# Patient Record
Sex: Male | Born: 1985 | Race: White | Hispanic: No | Smoking: Current every day smoker
Health system: Southern US, Community
[De-identification: ages and names within clinical notes are randomized; demographics above are authoritative.]

## PROBLEM LIST (undated history)

## (undated) DIAGNOSIS — F1990 Other psychoactive substance use, unspecified, uncomplicated: Secondary | ICD-10-CM

## (undated) DIAGNOSIS — F1920 Other psychoactive substance dependence, uncomplicated: Secondary | ICD-10-CM

## (undated) DIAGNOSIS — B1920 Unspecified viral hepatitis C without hepatic coma: Secondary | ICD-10-CM

## (undated) DIAGNOSIS — R45851 Suicidal ideations: Secondary | ICD-10-CM

---

## 2016-10-17 ENCOUNTER — Emergency Department (HOSPITAL_COMMUNITY): Payer: Self-pay

## 2016-10-17 ENCOUNTER — Other Ambulatory Visit (HOSPITAL_COMMUNITY): Payer: Self-pay

## 2016-10-17 ENCOUNTER — Inpatient Hospital Stay (HOSPITAL_COMMUNITY)
Admission: EM | Admit: 2016-10-17 | Discharge: 2016-10-18 | DRG: 917 | Disposition: A | Discharge status: Home / Self Care | Payer: Self-pay | Attending: Internal Medicine | Admitting: Internal Medicine

## 2016-10-17 ENCOUNTER — Encounter (HOSPITAL_COMMUNITY): Payer: Self-pay

## 2016-10-17 ENCOUNTER — Inpatient Hospital Stay (HOSPITAL_COMMUNITY): Payer: Self-pay

## 2016-10-17 DIAGNOSIS — I248 Other forms of acute ischemic heart disease: Secondary | ICD-10-CM | POA: Diagnosis present

## 2016-10-17 DIAGNOSIS — T50901A Poisoning by unspecified drugs, medicaments and biological substances, accidental (unintentional), initial encounter: Secondary | ICD-10-CM | POA: Insufficient documentation

## 2016-10-17 DIAGNOSIS — K029 Dental caries, unspecified: Secondary | ICD-10-CM | POA: Diagnosis present

## 2016-10-17 DIAGNOSIS — F17210 Nicotine dependence, cigarettes, uncomplicated: Secondary | ICD-10-CM | POA: Diagnosis present

## 2016-10-17 DIAGNOSIS — J9601 Acute respiratory failure with hypoxia: Secondary | ICD-10-CM

## 2016-10-17 DIAGNOSIS — J810 Acute pulmonary edema: Secondary | ICD-10-CM | POA: Diagnosis present

## 2016-10-17 DIAGNOSIS — R778 Other specified abnormalities of plasma proteins: Secondary | ICD-10-CM

## 2016-10-17 DIAGNOSIS — J9621 Acute and chronic respiratory failure with hypoxia: Secondary | ICD-10-CM | POA: Diagnosis present

## 2016-10-17 DIAGNOSIS — Z79899 Other long term (current) drug therapy: Secondary | ICD-10-CM

## 2016-10-17 DIAGNOSIS — R0902 Hypoxemia: Secondary | ICD-10-CM

## 2016-10-17 DIAGNOSIS — R7401 Elevation of levels of liver transaminase levels: Secondary | ICD-10-CM

## 2016-10-17 DIAGNOSIS — K047 Periapical abscess without sinus: Secondary | ICD-10-CM | POA: Diagnosis present

## 2016-10-17 DIAGNOSIS — R740 Nonspecific elevation of levels of transaminase and lactic acid dehydrogenase [LDH]: Secondary | ICD-10-CM | POA: Diagnosis present

## 2016-10-17 DIAGNOSIS — R7989 Other specified abnormal findings of blood chemistry: Secondary | ICD-10-CM

## 2016-10-17 DIAGNOSIS — Z882 Allergy status to sulfonamides status: Secondary | ICD-10-CM

## 2016-10-17 DIAGNOSIS — T401X1A Poisoning by heroin, accidental (unintentional), initial encounter: Principal | ICD-10-CM | POA: Diagnosis present

## 2016-10-17 DIAGNOSIS — J811 Chronic pulmonary edema: Secondary | ICD-10-CM | POA: Insufficient documentation

## 2016-10-17 LAB — I-STAT TROPONIN, ED: Troponin i, poc: 0.12 ng/mL (ref 0.00–0.08)

## 2016-10-17 LAB — BLOOD GAS, VENOUS
ACID-BASE EXCESS: 0.9 mmol/L (ref 0.0–2.0)
Bicarbonate: 29.2 mmol/L — ABNORMAL HIGH (ref 20.0–28.0)
FIO2: 1
O2 Saturation: 29.4 %
PATIENT TEMPERATURE: 98.6
PH VEN: 7.281 (ref 7.250–7.430)
pCO2, Ven: 64.1 mmHg — ABNORMAL HIGH (ref 44.0–60.0)

## 2016-10-17 LAB — MRSA PCR SCREENING: MRSA BY PCR: NEGATIVE

## 2016-10-17 LAB — HEPATIC FUNCTION PANEL
ALK PHOS: 50 U/L (ref 38–126)
ALT: 94 U/L — AB (ref 17–63)
AST: 57 U/L — ABNORMAL HIGH (ref 15–41)
Albumin: 3.8 g/dL (ref 3.5–5.0)
BILIRUBIN DIRECT: 0.1 mg/dL (ref 0.1–0.5)
Indirect Bilirubin: 0.5 mg/dL (ref 0.3–0.9)
TOTAL PROTEIN: 6.9 g/dL (ref 6.5–8.1)
Total Bilirubin: 0.6 mg/dL (ref 0.3–1.2)

## 2016-10-17 LAB — CBC
HEMATOCRIT: 42.6 % (ref 39.0–52.0)
Hemoglobin: 15.3 g/dL (ref 13.0–17.0)
MCH: 33.6 pg (ref 26.0–34.0)
MCHC: 35.9 g/dL (ref 30.0–36.0)
MCV: 93.4 fL (ref 78.0–100.0)
Platelets: 199 10*3/uL (ref 150–400)
RBC: 4.56 MIL/uL (ref 4.22–5.81)
RDW: 13.2 % (ref 11.5–15.5)
WBC: 13.7 10*3/uL — ABNORMAL HIGH (ref 4.0–10.5)

## 2016-10-17 LAB — BASIC METABOLIC PANEL
ANION GAP: 11 (ref 5–15)
BUN: 17 mg/dL (ref 6–20)
CALCIUM: 8.5 mg/dL — AB (ref 8.9–10.3)
CO2: 21 mmol/L — AB (ref 22–32)
CREATININE: 0.99 mg/dL (ref 0.61–1.24)
Chloride: 106 mmol/L (ref 101–111)
GFR calc Af Amer: >60 mL/min (ref >60)
GFR calc non Af Amer: >60 mL/min (ref >60)
GLUCOSE: 149 mg/dL — AB (ref 65–99)
Potassium: 3.7 mmol/L (ref 3.5–5.1)
Sodium: 138 mmol/L (ref 135–145)

## 2016-10-17 LAB — BRAIN NATRIURETIC PEPTIDE: B Natriuretic Peptide: 28.1 pg/mL (ref 0.0–100.0)

## 2016-10-17 LAB — TROPONIN I
TROPONIN I: 0.05 ng/mL — AB (ref <0.03)
Troponin I: 0.13 ng/mL (ref <0.03)
Troponin I: 0.09 ng/mL (ref <0.03)

## 2016-10-17 LAB — D-DIMER, QUANTITATIVE: D-Dimer, Quant: 0.55 ug/mL-FEU — ABNORMAL HIGH (ref 0.00–0.50)

## 2016-10-17 LAB — MAGNESIUM: MAGNESIUM: 1.8 mg/dL (ref 1.7–2.4)

## 2016-10-17 MED ORDER — ENOXAPARIN SODIUM 40 MG/0.4ML ~~LOC~~ SOLN: 40.0000 mg | Freq: Every day | SUBCUTANEOUS | Status: DC

## 2016-10-17 MED ORDER — DEXAMETHASONE SODIUM PHOSPHATE 10 MG/ML IJ SOLN
10.0000 mg | Freq: Once | INTRAMUSCULAR | Status: AC
  Administered 2016-10-17: 10 mg via INTRAVENOUS
  Filled 2016-10-17: qty 1

## 2016-10-17 MED ORDER — KETOROLAC TROMETHAMINE 15 MG/ML IJ SOLN
15.0000 mg | Freq: Once | INTRAMUSCULAR | Status: AC
  Administered 2016-10-17: 15 mg via INTRAVENOUS
  Filled 2016-10-17: qty 1

## 2016-10-17 MED ORDER — ONDANSETRON HCL 4 MG/2ML IJ SOLN: 4.0000 mg | Freq: Four times a day (QID) | INTRAMUSCULAR | Status: DC | PRN

## 2016-10-17 MED ORDER — AMOXICILLIN 500 MG PO CAPS
500.0000 mg | ORAL_CAPSULE | Freq: Three times a day (TID) | ORAL | Status: DC
  Administered 2016-10-18: 500 mg via ORAL
  Filled 2016-10-17 (×2): qty 1

## 2016-10-17 MED ORDER — IOPAMIDOL (ISOVUE-370) INJECTION 76%: 100.0000 mL | Freq: Once | INTRAVENOUS | Status: DC | PRN

## 2016-10-17 MED ORDER — NICOTINE 21 MG/24HR TD PT24: 21.0000 mg | MEDICATED_PATCH | Freq: Every day | TRANSDERMAL | Status: DC

## 2016-10-17 MED ORDER — FUROSEMIDE 10 MG/ML IJ SOLN
20.0000 mg | Freq: Once | INTRAMUSCULAR | Status: AC
  Administered 2016-10-17: 20 mg via INTRAVENOUS
  Filled 2016-10-17: qty 4

## 2016-10-17 MED ORDER — IOPAMIDOL (ISOVUE-370) INJECTION 76%
INTRAVENOUS | Status: AC
  Filled 2016-10-17: qty 100

## 2016-10-17 MED ORDER — NICOTINE 21 MG/24HR TD PT24
21.0000 mg | MEDICATED_PATCH | Freq: Once | TRANSDERMAL | Status: AC
  Administered 2016-10-17: 21 mg via TRANSDERMAL
  Filled 2016-10-17: qty 1

## 2016-10-17 MED ORDER — ONDANSETRON HCL 4 MG PO TABS: 4.0000 mg | ORAL_TABLET | Freq: Four times a day (QID) | ORAL | Status: DC | PRN

## 2016-10-17 MED ORDER — SERTRALINE HCL 100 MG PO TABS: 200.0000 mg | ORAL_TABLET | Freq: Every day | ORAL | Status: DC

## 2016-10-17 MED ORDER — FUROSEMIDE 10 MG/ML IJ SOLN: 40.0000 mg | Freq: Once | INTRAMUSCULAR | Status: DC

## 2016-10-17 MED ORDER — SODIUM CHLORIDE 0.9 % IV BOLUS (SEPSIS): 1000.0000 mL | Freq: Once | INTRAVENOUS | Status: DC

## 2016-10-17 MED ORDER — SODIUM CHLORIDE 0.9 % IV SOLN: 3.0000 g | Freq: Once | INTRAVENOUS | Status: DC

## 2016-10-17 MED ORDER — GABAPENTIN 100 MG PO CAPS
300.0000 mg | ORAL_CAPSULE | Freq: Every day | ORAL | Status: DC
  Administered 2016-10-17: 300 mg via ORAL
  Filled 2016-10-17: qty 3

## 2016-10-17 MED ORDER — TRAMADOL HCL 50 MG PO TABS
50.0000 mg | ORAL_TABLET | Freq: Four times a day (QID) | ORAL | Status: DC | PRN
  Administered 2016-10-17: 50 mg via ORAL
  Filled 2016-10-17: qty 1

## 2016-10-17 MED ORDER — SODIUM CHLORIDE 0.9 % IV SOLN
3.0000 g | Freq: Four times a day (QID) | INTRAVENOUS | Status: DC
  Administered 2016-10-17: 3 g via INTRAVENOUS
  Filled 2016-10-17 (×3): qty 3

## 2016-10-17 MED ORDER — ACETAMINOPHEN 325 MG PO TABS: 650.0000 mg | ORAL_TABLET | Freq: Four times a day (QID) | ORAL | Status: DC | PRN

## 2016-10-17 MED ORDER — SODIUM CHLORIDE 0.9 % IV SOLN
3.0000 g | Freq: Once | INTRAVENOUS | Status: AC
  Administered 2016-10-17: 3 g via INTRAVENOUS
  Filled 2016-10-17: qty 3

## 2016-10-17 MED ORDER — ASPIRIN 81 MG PO CHEW
324.0000 mg | CHEWABLE_TABLET | Freq: Once | ORAL | Status: AC
  Administered 2016-10-17: 324 mg via ORAL
  Filled 2016-10-17: qty 4

## 2016-10-17 MED ORDER — LEVALBUTEROL HCL 0.63 MG/3ML IN NEBU: 0.6300 mg | INHALATION_SOLUTION | Freq: Four times a day (QID) | RESPIRATORY_TRACT | Status: DC | PRN

## 2016-10-17 MED ORDER — ACETAMINOPHEN 650 MG RE SUPP: 650.0000 mg | Freq: Four times a day (QID) | RECTAL | Status: DC | PRN

## 2016-10-17 MED ORDER — IPRATROPIUM-ALBUTEROL 0.5-2.5 (3) MG/3ML IN SOLN
3.0000 mL | Freq: Once | RESPIRATORY_TRACT | Status: AC
  Administered 2016-10-17: 3 mL via RESPIRATORY_TRACT
  Filled 2016-10-17: qty 3

## 2016-10-17 MED ORDER — GABAPENTIN 100 MG PO CAPS: 100.0000 mg | ORAL_CAPSULE | Freq: Every day | ORAL | Status: DC

## 2016-10-17 NOTE — ED Notes (Signed)
Bed: RESB Expected date:  Expected time:  Means of arrival:  Comments: EMS overdose 

## 2016-10-17 NOTE — Progress Notes (Signed)
Patient difficult IV stick/start. Multiple attempts made by RNs, IV team and Radiology tech without any success. Patient states he is always a difficult stick for IVs and doesn't want any more attempts made because he is going home in morning. [facility]level on call paged and made aware. Unable to complete CT angio and give ordered IV antibiotics. Patient requested that PIV located in right upper arm be removed because it has become painful. Will continue to monitor.

## 2016-10-17 NOTE — Progress Notes (Signed)
Pharmacy Antibiotic Note  George Walls is a 31 y.o. male admitted on 10/17/2016 with dental infection.  Pharmacy has been consulted for Unasyn dosing.  Plan: Unasyn 3 gr IV q6h  Monitor clinical course, renal function, cultures as available  Dosage will likely  remain stable at above dosage  and need for further dosage adjustment appears unlikely at present.    Will sign off at this time.  Please reconsult if a change in clinical status warrants re-evaluation of dosage.     Height: 5\' 11"  (180.3 cm) Weight: 185 lb (83.9 kg) IBW/kg (Calculated) : 75.3  Temp (24hrs), Avg:97.6 F (36.4 C), Min:97.6 F (36.4 C), Max:97.6 F (36.4 C)   Recent Labs Lab 10/17/16 0810  WBC 13.7*  CREATININE 0.99    Estimated Creatinine Clearance: 115.1 mL/min (by C-G formula based on SCr of 0.99 mg/dL).    Allergies  Allergen Reactions  . Sulfa Antibiotics Other (See Comments)    Reaction:  Unknown     Antimicrobials this admission:  7/20 unasyn >>  Dose adjustments this admission:   Microbiology results: 7/20 HIV antibody  >>  Thank you for allowing pharmacy to be a part of this patient's care.  George Walls, PharmD, BCPS Pager [phone] 10/17/2016 12:55 PM

## 2016-10-17 NOTE — H&P (Addendum)
[facility] History and Physical  Drinda ButtsChristopher Egnor NWG:[id number] DOB: [birth year] DOA: 10/17/2016  Referring physician: ED  PCP: Patient, No Pcp Per   Chief Complaint: Altered mental status  HPI:  31 year old male with a history of substance abuse in the past, who presents to the ER after IV drug use. Patient used IV heroin last night, due to multiple reasons. Patient has had left to ache for several weeks and feels that that is a dental abscess, and is in a lot of pain. He also stated that his ex-wife just had a baby with another man, and this has stressed him out. Patient was found obtunded by his landlord who gave him Narcan, which arose him, after that he went to sleep. This morning patient was again found to be minimally responsive, landlord called EMS and fire department. He received 3 doses of Narcan was brought to the ER for further evaluation ED course BP (!) 152/74   Pulse (!) 101   Temp 97.6 F (36.4 C) (Axillary)   Resp (!) 29   Patient was found to be unresponsive initially, after receiving Narcan he became somewhat more awake. Chest x-ray showed pulmonary edema. Patient received Lasix 20 mg IV 1. As well as nebulizer treatments Patient was given IV Toradol and Decadron for pain, Currently patient is awake, denies any homicidal or suicidal ideation. He is being admitted to step down for acute hypoxic respiratory failure, as he is currently needing 10 L of oxygen    Review of Systems: negative for the following  Constitutional: Positive for decreased responsiveness  HEENT: Denies photophobia, eye pain, redness, hearing loss, ear pain, congestion, sore throat, rhinorrhea, sneezing, mouth sores, trouble swallowing, neck pain, neck stiffness and tinnitus.  Respiratory: Denies SOB, DOE, cough, chest tightness, and wheezing.  Cardiovascular: Denies chest pain, palpitations and leg swelling.  Gastrointestinal: Denies nausea, vomiting, abdominal pain, diarrhea,  constipation, blood in stool and abdominal distention.  Genitourinary: Denies dysuria, urgency, frequency, hematuria, flank pain and difficulty urinating.  Musculoskeletal: Denies myalgias, back pain, joint swelling, arthralgias and gait problem.  Skin: Denies pallor, rash and wound.  Neurological: Denies dizziness, seizures, syncope, weakness, light-headedness, numbness and headaches.  Hematological: Denies adenopathy. Easy bruising, personal or family bleeding history  Psychiatric/Behavioral: Denies suicidal ideation, mood changes, confusion, nervousness, sleep disturbance and agitation       History reviewed. No pertinent past medical history.   History reviewed. No pertinent surgical history.    Social History:  reports that he has been smoking Cigarettes.  He has been smoking about 2.00 packs per day. He has never used smokeless tobacco. He reports that he uses drugs, including Cocaine and Marijuana. He reports that he does not drink alcohol.    Allergies  Allergen Reactions  . Sulfa Antibiotics Other (See Comments)    Reaction:  Unknown         FAMILY HISTORY  When questioned  Directly-patient reports  No family history of HTN, CVA ,DIABETES, TB, Cancer CAD, Bleeding Disorders, Sickle Cell, diabetes, anemia, asthma,   Prior to Admission medications   Medication Sig Start Date End Date Taking? Authorizing Provider  gabapentin (NEURONTIN) 100 MG capsule Take 100-300 mg by mouth 2 (two) times daily. Pt takes one in the morning and three at bedtime.   Yes [provider]  sertraline (ZOLOFT) 100 MG tablet Take 200 mg by mouth daily.   Yes [provider]     Physical Exam: Vitals:   10/17/16 [id number] 10/17/16 [id number] 10/17/16 0950 10/17/16  1028  BP: 126/84  124/85 (!) 152/74  Pulse: 100  (!) 107 (!) 101  Resp: 15  20 (!) 29  Temp:      TempSrc:      SpO2: 95% 96% 94% 96%        Vitals:   10/17/16 0815 10/17/16 0844 10/17/16 0950 10/17/16 1028  BP:  126/84  124/85 (!) 152/74  Pulse: 100  (!) 107 (!) 101  Resp: 15  20 (!) 29  Temp:      TempSrc:      SpO2: 95% 96% 94% 96%   Constitutional: NAD, Anxious,, On 10 L of oxygen Eyes: PERRL, lids and conjunctivae normal ENMT: Mucous membranes are moist. Posterior pharynx clear of any exudate or lesions.Normal dentition.  Neck: normal, supple, no masses, no thyromegaly Respiratory: Normal respiratory effort    No respiratory distress. He has no wheezes. He has rales in the right upper field, the right middle field, the right lower field, the left upper field, the left middle field and the left lower field Cardiovascular: Regular rate and rhythm, no murmurs / rubs / gallops. No extremity edema. 2+ pedal pulses. No carotid bruits.  Abdomen: no tenderness, no masses palpated. No hepatosplenomegaly. Bowel sounds positive.  Musculoskeletal: no clubbing / cyanosis. No joint deformity upper and lower extremities. Good ROM, no contractures. Normal muscle tone.  Skin: no rashes, lesions, ulcers. No induration Neurologic: CN 2-12 grossly intact. Sensation intact, DTR normal. Strength 5/5 in all 4.  Psychiatric: Normal judgment and insight. Alert and oriented x 3. Normal mood.     Labs on Admission: I have personally reviewed following labs and imaging studies  CBC:  Recent Labs Lab 10/17/16 0810  WBC 13.7*  HGB 15.3  HCT 42.6  MCV 93.4  PLT 199    Basic Metabolic Panel:  Recent Labs Lab 10/17/16 0810  NA 138  K 3.7  CL 106  CO2 21*  GLUCOSE 149*  BUN 17  CREATININE 0.99  CALCIUM 8.5*    GFR: CrCl cannot be calculated (Unknown ideal weight.).  Liver Function Tests: No results for input(s): AST, ALT, ALKPHOS, BILITOT, PROT, ALBUMIN in the last 168 hours. No results for input(s): LIPASE, AMYLASE in the last 168 hours. No results for input(s): AMMONIA in the last 168 hours.  Coagulation Profile: No results for input(s): INR, PROTIME in the last 168 hours. No results for  input(s): DDIMER in the last 72 hours.  Cardiac Enzymes: No results for input(s): CKTOTAL, CKMB, CKMBINDEX, TROPONINI in the last 168 hours.  BNP (last 3 results) No results for input(s): PROBNP in the last 8760 hours.  HbA1C: No results for input(s): HGBA1C in the last 72 hours. No results found for: HGBA1C   CBG: No results for input(s): GLUCAP in the last 168 hours.  Lipid Profile: No results for input(s): CHOL, HDL, LDLCALC, TRIG, CHOLHDL, LDLDIRECT in the last 72 hours.  Thyroid Function Tests: No results for input(s): TSH, T4TOTAL, FREET4, T3FREE, THYROIDAB in the last 72 hours.  Anemia Panel: No results for input(s): VITAMINB12, FOLATE, FERRITIN, TIBC, IRON, RETICCTPCT in the last 72 hours.  Urine analysis: No results found for: COLORURINE, APPEARANCEUR, LABSPEC, PHURINE, GLUCOSEU, HGBUR, BILIRUBINUR, KETONESUR, PROTEINUR, UROBILINOGEN, NITRITE, LEUKOCYTESUR  Sepsis Labs: @LABRCNTIP (procalcitonin:4,lacticidven:4) )No results found for this or any previous visit (from the past 240 hour(s)).       Radiological Exams on Admission: Dg Chest Portable 1 View  Result Date: 10/17/2016 CLINICAL DATA:  31 year old male with cough, shortness of breath  and hypoxia status post opiate overdose. EXAM: PORTABLE CHEST 1 VIEW COMPARISON:  None. FINDINGS: Portable AP semi upright view at 0829 hours. Normal cardiac size and mediastinal contours. Visualized tracheal air column is within normal limits. No pneumothorax, pleural effusion or consolidation. However, there is patchy bilateral indistinct increased pulmonary interstitial opacity. No acute osseous abnormality identified. Paucity of bowel gas in the upper abdomen. IMPRESSION: Positive for pulmonary edema. No other acute cardiopulmonary abnormality. Electronically Signed   By: Odessa Fleming M.D.   On: 10/17/2016 08:39   Dg Chest Portable 1 View  Result Date: 10/17/2016 CLINICAL DATA:  31 year old male with cough, shortness of breath and  hypoxia status post opiate overdose. EXAM: PORTABLE CHEST 1 VIEW COMPARISON:  None. FINDINGS: Portable AP semi upright view at 0829 hours. Normal cardiac size and mediastinal contours. Visualized tracheal air column is within normal limits. No pneumothorax, pleural effusion or consolidation. However, there is patchy bilateral indistinct increased pulmonary interstitial opacity. No acute osseous abnormality identified. Paucity of bowel gas in the upper abdomen. IMPRESSION: Positive for pulmonary edema. No other acute cardiopulmonary abnormality. Electronically Signed   By: Odessa Fleming M.D.   On: 10/17/2016 08:39      EKG: Independently reviewed.  Sinus tachycardia No old tracing to compare   Assessment/Plan Principal Problem:   Noncardiogenic pulmonary edema/acute hypoxic respiratory failure  NCPE Secondary to IV drug use Currently on 10 L, patient will be admitted to step down for closer monitoring Patient did receive Lasix 20 mg IV 1, will give another dose and assess need for future doses Troponin mildly elevated, trend troponin Echo to rule out wall motion abnormalities D-dimer elevated , will check CT PE study to r/o PE      Drug overdose This seemed to be accidental and patient has not intent to harm himself He relapsed after 6 months, he regrets it Monitor for withdrawal   Dental abscess Maxillofacial CT to rule out drainable abscess Continue Unasyn  DVT prophylaxis:  Lovenox     Code Status Orders Full code           consults called:None  Family Communication: Admission, patients condition and plan of care including tests being ordered have been discussed with the patient  who indicates understanding and agree with the plan and Code Status  Admission status: inpatient    Disposition plan: Further plan will depend as patient's clinical course evolves and further radiologic and laboratory data become available. Likely home when stable   At the time of admission, it  appears that the appropriate admission status for this patient is INPATIENT .Thisis judged to be reasonable and necessary in order to provide the required intensity of service to ensure the patient's safetygiven thepresenting symptoms, physical exam findings, and initial radiographic and laboratory data in the context of their chronic comorbidities.   Richarda Overlie MD [facility] Pager [phone]  If 7PM-7AM, please contact night-coverage [email or web address] Password [facility]  10/17/2016, 10:49 AM

## 2016-10-17 NOTE — ED Notes (Signed)
Two unsuccessful attempts at peripheral iv.  

## 2016-10-17 NOTE — ED Provider Notes (Signed)
[facility]-EMERGENCY DEPT Provider Note   CSN: [id number] Arrival date & time: 10/17/16  4098     History   Chief Complaint Chief Complaint  Patient presents with  . Drug Overdose    HPI George Walls is a 31 y.o. male.  HPI   31 year old male with past medical history of substance abuse here with accidental overdose. The patient states that last night, he used heroin that he believes was placed with fentanyl. He did this because he has had increasing tooth pain from multiple dental infections. He states that after using, he does not remember much. He reportedly was given Narcan last night by his landlord then went back to sleep. He was found minimally responsive this morning and fire department was called. Patient has received a total of 3 mg of Narcan prior to arrival. He states he is more awake now and feels nauseous and fatigued after the Narcan. He endorses significant shortness of breath as well. He states he did not have shortness of breath prior to using heroin yesterday. Denies any intentional overdose. Denies any chest pain.  History reviewed. No pertinent past medical history.  There are no active problems to display for this patient.   History reviewed. No pertinent surgical history.     Home Medications    Prior to Admission medications   Medication Sig Start Date End Date Taking? Authorizing Provider  gabapentin (NEURONTIN) 100 MG capsule Take 100-300 mg by mouth 2 (two) times daily. Pt takes one in the morning and three at bedtime.   Yes [provider]  sertraline (ZOLOFT) 100 MG tablet Take 200 mg by mouth daily.   Yes [provider]    Family History History reviewed. No pertinent family history.  Social History Social History  Substance Use Topics  . Smoking status: Current Every Day Smoker    Packs/day: 2.00    Types: Cigarettes  . Smokeless tobacco: Never Used  . Alcohol use No     Allergies   Sulfa  antibiotics   Review of Systems Review of Systems  Constitutional: Positive for fatigue.  Respiratory: Positive for cough and shortness of breath.   All other systems reviewed and are negative.    Physical Exam Updated Vital Signs BP (!) 152/74   Pulse (!) 101   Temp 97.6 F (36.4 C) (Axillary)   Resp (!) 29   SpO2 96%   Physical Exam  Constitutional: He is oriented to person, place, and time. He appears well-developed and well-nourished. No distress.  HENT:  Head: Normocephalic and atraumatic.  Eyes: Conjunctivae are normal.  Neck: Neck supple.  Cardiovascular: Regular rhythm and normal heart sounds.  Tachycardia present.  Exam reveals no friction rub.   No murmur heard. Pulmonary/Chest: Effort normal. Tachypnea noted. No respiratory distress. He has no wheezes. He has rales in the right upper field, the right middle field, the right lower field, the left upper field, the left middle field and the left lower field.  Abdominal: He exhibits no distension.  Musculoskeletal: He exhibits no edema.  Neurological: He is alert and oriented to person, place, and time. He exhibits normal muscle tone.  Skin: Skin is warm. Capillary refill takes less than 2 seconds.  Psychiatric: He has a normal mood and affect.  Nursing note and vitals reviewed.    ED Treatments / Results  Labs (all labs ordered are listed, but only abnormal results are displayed) Labs Reviewed  CBC - Abnormal; Notable for the following:  Result Value   WBC 13.7 (*)    All other components within normal limits  BASIC METABOLIC PANEL - Abnormal; Notable for the following:    CO2 21 (*)    Glucose, Bld 149 (*)    Calcium 8.5 (*)    All other components within normal limits  BLOOD GAS, VENOUS - Abnormal; Notable for the following:    pCO2, Ven 64.1 (*)    Bicarbonate 29.2 (*)    All other components within normal limits  I-STAT TROPONIN, ED - Abnormal; Notable for the following:    Troponin i, poc 0.12  (*)    All other components within normal limits  BRAIN NATRIURETIC PEPTIDE    EKG  EKG Interpretation  Date/Time:  Friday October 17 2016 07:38:07 EDT Ventricular Rate:  106 PR Interval:    QRS Duration: 91 QT Interval:  341 QTC Calculation: 453 R Axis:   72 Text Interpretation:  Sinus tachycardia No old tracing to compare Confirmed by Shaune Pollack [id number]) on 10/17/2016 8:10:46 AM       Radiology Dg Chest Portable 1 View  Result Date: 10/17/2016 CLINICAL DATA:  31 year old male with cough, shortness of breath and hypoxia status post opiate overdose. EXAM: PORTABLE CHEST 1 VIEW COMPARISON:  None. FINDINGS: Portable AP semi upright view at 0829 hours. Normal cardiac size and mediastinal contours. Visualized tracheal air column is within normal limits. No pneumothorax, pleural effusion or consolidation. However, there is patchy bilateral indistinct increased pulmonary interstitial opacity. No acute osseous abnormality identified. Paucity of bowel gas in the upper abdomen. IMPRESSION: Positive for pulmonary edema. No other acute cardiopulmonary abnormality. Electronically Signed   By: Odessa Fleming M.D.   On: 10/17/2016 08:39    Procedures .Critical Care Performed by: Shaune Pollack Authorized by: Shaune Pollack     (including critical care time)  CRITICAL CARE Performed by: Dollene Cleveland   Total critical care time: 35 minutes  Critical care time was exclusive of separately billable procedures and treating other patients.  Critical care was necessary to treat or prevent imminent or life-threatening deterioration.  Critical care was time spent personally by me on the following activities: development of treatment plan with patient and/or surrogate as well as nursing, discussions with consultants, evaluation of patient's response to treatment, examination of patient, obtaining history from patient or surrogate, ordering and performing treatments and interventions, ordering and  review of laboratory studies, ordering and review of radiographic studies, pulse oximetry and re-evaluation of patient's condition.    Medications Ordered in ED Medications  nicotine (NICODERM CQ - dosed in mg/24 hours) patch 21 mg (21 mg Transdermal Patch Applied 10/17/16 0959)  aspirin chewable tablet 324 mg (not administered)  ipratropium-albuterol (DUONEB) 0.5-2.5 (3) MG/3ML nebulizer solution 3 mL (3 mLs Nebulization Given 10/17/16 0844)  furosemide (LASIX) injection 20 mg (20 mg Intravenous Given 10/17/16 0952)  Ampicillin-Sulbactam (UNASYN) 3 g in sodium chloride 0.9 % 100 mL IVPB (3 g Intravenous New Bag/Given 10/17/16 0957)  ketorolac (TORADOL) 15 MG/ML injection 15 mg (15 mg Intravenous Given 10/17/16 0952)  dexamethasone (DECADRON) injection 10 mg (10 mg Intravenous Given 10/17/16 1000)     Initial Impression / Assessment and Plan / ED Course  I have reviewed the triage vital signs and the nursing notes.  Pertinent labs & imaging results that were available during my care of the patient were reviewed by me and considered in my medical decision making (see chart for details).     31 yo M with PMHx heroin  abuse here with accidental overdose, received narcan last night and again this AM. On exam, pt is hypoxic, with increased WOB and diffuse rales. CXR shows pulmonary edema. Suspect flash pulmonary edema with subsequent hypoxic respiratory failure, likely 2/2 prolonged apnea and narcan use. Pt given low dose of lasix, will admit to step down for pulmonary monitoring. Pt awake, alert at this time - no indication for narcan gtt at this time. Pt increasingly alert, awake despite mild resp acidosis on VBG - will continue to monitor, declines BiPAP at this time and given that he is more awake, alert, feel this is reasonable.  Final Clinical Impressions(s) / ED Diagnoses   Final diagnoses:  Accidental overdose of heroin, initial encounter  Acute pulmonary edema (HCC)  Elevated troponin   Acute respiratory failure with hypoxia (HCC)      Shaune PollackIsaacs, Shanell Aden, MD 10/17/16 1040

## 2016-10-17 NOTE — ED Notes (Signed)
Pt is irritated. Continues to take his oxygen off. Pt tried to leave and continued to pull of cardiac monitoring, BP cuff, and Oxygen. Convinced pt to calm down and please stay.

## 2016-10-17 NOTE — ED Triage Notes (Signed)
Per EMS, pt is coming from home after the landlord called and stated that pt was found unresponsive after overdosing on heroin. The landlord and [employer] administered narcan this morning. The landlord also reports administering narcan at approx. 2200 last night when the pt was initially found unresponsive.

## 2016-10-18 ENCOUNTER — Inpatient Hospital Stay (HOSPITAL_COMMUNITY): Payer: Self-pay

## 2016-10-18 DIAGNOSIS — R740 Nonspecific elevation of levels of transaminase and lactic acid dehydrogenase [LDH]: Secondary | ICD-10-CM

## 2016-10-18 DIAGNOSIS — J811 Chronic pulmonary edema: Secondary | ICD-10-CM

## 2016-10-18 DIAGNOSIS — J9621 Acute and chronic respiratory failure with hypoxia: Secondary | ICD-10-CM

## 2016-10-18 DIAGNOSIS — R7401 Elevation of levels of liver transaminase levels: Secondary | ICD-10-CM

## 2016-10-18 DIAGNOSIS — K047 Periapical abscess without sinus: Secondary | ICD-10-CM

## 2016-10-18 DIAGNOSIS — R7989 Other specified abnormal findings of blood chemistry: Secondary | ICD-10-CM

## 2016-10-18 DIAGNOSIS — R778 Other specified abnormalities of plasma proteins: Secondary | ICD-10-CM

## 2016-10-18 DIAGNOSIS — T401X1A Poisoning by heroin, accidental (unintentional), initial encounter: Principal | ICD-10-CM

## 2016-10-18 LAB — COMPREHENSIVE METABOLIC PANEL
ALT: 73 U/L — AB (ref 17–63)
AST: 42 U/L — AB (ref 15–41)
Albumin: 3.3 g/dL — ABNORMAL LOW (ref 3.5–5.0)
Alkaline Phosphatase: 39 U/L (ref 38–126)
Anion gap: 8 (ref 5–15)
BILIRUBIN TOTAL: 0.5 mg/dL (ref 0.3–1.2)
BUN: 19 mg/dL (ref 6–20)
CO2: 25 mmol/L (ref 22–32)
CREATININE: 0.85 mg/dL (ref 0.61–1.24)
Calcium: 8.4 mg/dL — ABNORMAL LOW (ref 8.9–10.3)
Chloride: 105 mmol/L (ref 101–111)
Glucose, Bld: 121 mg/dL — ABNORMAL HIGH (ref 65–99)
Potassium: 3.7 mmol/L (ref 3.5–5.1)
Sodium: 138 mmol/L (ref 135–145)
TOTAL PROTEIN: 6 g/dL — AB (ref 6.5–8.1)

## 2016-10-18 LAB — CBC
HCT: 35.8 % — ABNORMAL LOW (ref 39.0–52.0)
Hemoglobin: 12.4 g/dL — ABNORMAL LOW (ref 13.0–17.0)
MCH: 33.2 pg (ref 26.0–34.0)
MCHC: 34.6 g/dL (ref 30.0–36.0)
MCV: 95.7 fL (ref 78.0–100.0)
PLATELETS: 174 10*3/uL (ref 150–400)
RBC: 3.74 MIL/uL — ABNORMAL LOW (ref 4.22–5.81)
RDW: 13.3 % (ref 11.5–15.5)
WBC: 11.2 10*3/uL — AB (ref 4.0–10.5)

## 2016-10-18 LAB — HIV ANTIBODY (ROUTINE TESTING W REFLEX): HIV Screen 4th Generation wRfx: NONREACTIVE

## 2016-10-18 MED ORDER — VITAMINS A & D EX OINT
TOPICAL_OINTMENT | CUTANEOUS | Status: DC
Start: 2016-10-18 — End: 2016-10-18
  Filled 2016-10-18: qty 5

## 2016-10-18 MED ORDER — AMOXICILLIN-POT CLAVULANATE 875-125 MG PO TABS: 1.0000 | ORAL_TABLET | Freq: Two times a day (BID) | ORAL | 0 refills | Status: DC

## 2016-10-18 MED ORDER — TRAMADOL HCL 50 MG PO TABS: 50.0000 mg | ORAL_TABLET | Freq: Four times a day (QID) | ORAL | 0 refills | Status: DC | PRN

## 2016-10-18 MED ORDER — AMOXICILLIN-POT CLAVULANATE 875-125 MG PO TABS: 1.0000 | ORAL_TABLET | Freq: Two times a day (BID) | ORAL | Status: DC

## 2016-10-18 NOTE — Discharge Summary (Addendum)
Physician Discharge Summary  Drinda ButtsChristopher Nitta NFA:[id number] DOB: September 30, 1985 DOA: 10/17/2016  PCP: Patient, No Pcp Per  Admit date: 10/17/2016 Discharge date: 10/18/2016  Admitted From: home Disposition:  Home  Recommendations for Outpatient Follow-up:  1. Follow up with Dentist  1 weeks   Home Health:No Equipment/Devices:none  Discharge Condition:stable CODE STATUS:full Diet recommendation:  Regular  Brief/Interim Summary: 31 y.o. 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 Patient was obtunded but his landlord Narcan was given to him which he arose but then went back to sleep, the landlord and called EMS he received 3 doses of Narcan and was brought to the ED  Discharge Diagnoses:  Principal Problem:   Pulmonary edema, noncardiac Active Problems:   Accidental drug overdose   Acute on chronic respiratory failure with hypoxia (HCC)   Transaminitis   Elevated troponin  Acute on chronic respiratory failure with hypoxia (HCC) due to Pulmonary edema, noncardiac/Elevated troponins Improvement with Narcan. Patient did receive Lasix in the ED, Creatinine has remained at baseline, had a mild elevation in troponins have trended down, and the setting of acute hypoxic respiratory failure likely demand ischemia. Discontinue 2-D echo has no murmurs and physical exam.I have personally reviewed the Twelve-lead EKG shows sinus tachycardia, normal axis no T wave abnormalities. D-dimer was elevated due to chest compressions and probably IV drug abuse he is not tachycardic, he has no risk factors. Will DC CT scan of the chest.  Drug overdose Seems to be accidental he has access to 6 months.  Transaminitis HIV is negative. Condition does relate he has hepatitis C. We'll confirm with hepatitis panel  Dental abscesses Seen on CT  maxillofacial, one inflamed left sinus, blood cultures not ordered on admission. Local head and order them at this point, we'll send him home on Augmentin for 7 days he has a follow-up with a dentist next week.  Discharge Instructions  Discharge Instructions    Diet - low sodium heart healthy    Complete by:  As directed    Increase activity slowly    Complete by:  As directed      Allergies as of 10/18/2016      Reactions   Sulfa Antibiotics Other (See Comments)   Reaction:  Unknown       Medication List    TAKE these medications   amoxicillin-clavulanate 875-125 MG tablet Commonly known as:  AUGMENTIN Take 1 tablet by mouth every 12 (twelve) hours.   gabapentin 100 MG capsule Commonly known as:  NEURONTIN Take 100-300 mg by mouth 2 (two) times daily. Pt takes one in the morning and three at bedtime.   sertraline 100 MG tablet Commonly known as:  ZOLOFT Take 200 mg by mouth daily.   traMADol 50 MG tablet Commonly known as:  ULTRAM Take 1 tablet (50 mg total) by mouth every 6 (six) hours as needed for moderate pain.       Allergies  Allergen Reactions  . Sulfa Antibiotics Other (See Comments)    Reaction:  Unknown     Consultations:  None   Procedures/Studies: Dg Chest Portable 1 View  Result Date: 10/17/2016 CLINICAL DATA:  31 year old male with cough, shortness of breath and hypoxia status post opiate overdose. EXAM: PORTABLE CHEST 1 VIEW COMPARISON:  None. FINDINGS: Portable AP semi upright view at 0829 hours. Normal cardiac size and mediastinal contours.  Visualized tracheal air column is within normal limits. No pneumothorax, pleural effusion or consolidation. However, there is patchy bilateral indistinct increased pulmonary interstitial opacity. No acute osseous abnormality identified. Paucity of bowel gas in the upper abdomen. IMPRESSION: Positive for pulmonary edema. No other acute cardiopulmonary abnormality. Electronically Signed   By: Odessa Fleming M.D.   On:  10/17/2016 08:39   Ct Maxillofacial Wo Contrast  Result Date: 10/17/2016 CLINICAL DATA:  30 year old male with suspected left side dental infection. Left face easier and neck pain for 2 weeks. EXAM: CT MAXILLOFACIAL WITHOUT CONTRAST TECHNIQUE: Multidetector CT imaging of the maxillofacial structures was performed. Multiplanar CT image reconstructions were also generated. COMPARISON:  None. FINDINGS: Osseous: Widespread carious and poor dentition. Right maxillary and mandible posterior molars an wisdom teeth are among the worst (series 10, image 34). Prominent dental caries of the left mandible wisdom tooth, and left maxillary molar an wisdom tooth. Periapical lucency at the left maxillary wisdom tooth, and also the left maxillary canine, the root of which is in close proximity to alveolar recess mucosal thickening of the left maxillary sinus. See series 4, image 39 and series 10, image 55. No mandible osteomyelitis. No acute facial bone fracture. Visible central skullbase and calvarium appear intact. Negative visible cervical spine. Orbits: Bilateral orbital walls are intact. Bilateral orbits soft tissues are normal. Sinuses: Bilateral tympanic cavities and mastoids are clear. The right paranasal sinuses are clear. The left paranasal sinuses are clear aside from mild left maxillary sinus mucosal thickening, most pronounced at the alveolar recess. Rightward nasal septal deviation and spurring. Symmetric but mild bilateral nasal cavity mucosal thickening. Soft tissues: Negative noncontrast appearance of the visible larynx, pharynx, parapharyngeal spaces, retropharyngeal space, sublingual space, submandibular glands, and parotid glands. No cervical lymphadenopathy. Limited intracranial: Negative visualized noncontrast brain parenchyma. Visualized scalp soft tissues are within normal limits. IMPRESSION: 1. Poor dentition. On the left side the left maxillary canine and left maxillary posterior molar and  maxillary/mandible wisdom teeth are worst affected. No periapical lucency at the canine which is in proximity to the mildly inflamed left maxillary sinus. 2. No associated soft tissue cellulitis or soft tissue abscess is evident in the absence of IV contrast. Electronically Signed   By: Odessa Fleming M.D.   On: 10/17/2016 12:01    Subjective: No new complaints, he relates his pain is controlled.  Discharge Exam: Vitals:   10/17/16 2310 10/18/16 0335  BP: 127/70 120/64  Pulse: 94 97  Resp: (!) 51 18  Temp:     Vitals:   10/17/16 2200 10/17/16 2308 10/17/16 2310 10/18/16 0335  BP:   127/70 120/64  Pulse:   94 97  Resp:   (!) 51 18  Temp:  99.1 F (37.3 C)    TempSrc:  Oral    SpO2: 97%  95% 96%  Weight:      Height:        General: See progress note.    The results of significant diagnostics from this hospitalization (including imaging, microbiology, ancillary and laboratory) are listed below for reference.     Microbiology: Recent Results (from the past 240 hour(s))  MRSA PCR Screening     Status: None   Collection Time: 10/17/16  1:30 PM  Result Value Ref Range Status   MRSA by PCR NEGATIVE NEGATIVE Final    Comment:        The GeneXpert MRSA Assay (FDA approved for NASAL specimens only), is one component of a comprehensive MRSA colonization surveillance program.  It is not intended to diagnose MRSA infection nor to guide or monitor treatment for MRSA infections.      Labs: BNP (last 3 results)  Recent Labs  10/17/16 0810  BNP 28.1   Basic Metabolic Panel:  Recent Labs Lab 10/17/16 0810 10/17/16 1356 10/18/16 0312  NA 138  --  138  K 3.7  --  3.7  CL 106  --  105  CO2 21*  --  25  GLUCOSE 149*  --  121*  BUN 17  --  19  CREATININE 0.99  --  0.85  CALCIUM 8.5*  --  8.4*  MG  --  1.8  --    Liver Function Tests:  Recent Labs Lab 10/17/16 1356 10/18/16 0312  AST 57* 42*  ALT 94* 73*  ALKPHOS 50 39  BILITOT 0.6 0.5  PROT 6.9 6.0*  ALBUMIN  3.8 3.3*   No results for input(s): LIPASE, AMYLASE in the last 168 hours. No results for input(s): AMMONIA in the last 168 hours. CBC:  Recent Labs Lab 10/17/16 0810 10/18/16 0312  WBC 13.7* 11.2*  HGB 15.3 12.4*  HCT 42.6 35.8*  MCV 93.4 95.7  PLT 199 174   Cardiac Enzymes:  Recent Labs Lab 10/17/16 1356 10/17/16 1647 10/17/16 2302  TROPONINI 0.13* 0.09* 0.05*   BNP: Invalid input(s): POCBNP CBG: No results for input(s): GLUCAP in the last 168 hours. D-Dimer  Recent Labs  10/17/16 1356  DDIMER 0.55*   Hgb A1c No results for input(s): HGBA1C in the last 72 hours. Lipid Profile No results for input(s): CHOL, HDL, LDLCALC, TRIG, CHOLHDL, LDLDIRECT in the last 72 hours. Thyroid function studies No results for input(s): TSH, T4TOTAL, T3FREE, THYROIDAB in the last 72 hours.  Invalid input(s): FREET3 Anemia work up No results for input(s): VITAMINB12, FOLATE, FERRITIN, TIBC, IRON, RETICCTPCT in the last 72 hours. Urinalysis No results found for: COLORURINE, APPEARANCEUR, LABSPEC, PHURINE, GLUCOSEU, HGBUR, BILIRUBINUR, KETONESUR, PROTEINUR, UROBILINOGEN, NITRITE, LEUKOCYTESUR Sepsis Labs Invalid input(s): PROCALCITONIN,  WBC,  LACTICIDVEN Microbiology Recent Results (from the past 240 hour(s))  MRSA PCR Screening     Status: None   Collection Time: 10/17/16  1:30 PM  Result Value Ref Range Status   MRSA by PCR NEGATIVE NEGATIVE Final    Comment:        The GeneXpert MRSA Assay (FDA approved for NASAL specimens only), is one component of a comprehensive MRSA colonization surveillance program. It is not intended to diagnose MRSA infection nor to guide or monitor treatment for MRSA infections.      Time coordinating discharge: Over 30 minutes  SIGNED:   Marinda Elk, MD  [facility] 10/18/2016, 7:40 AM Pager   If 7PM-7AM, please contact night-coverage [email or web address] Password [email or web address]

## 2016-10-18 NOTE — Progress Notes (Signed)
[facility] PROGRESS NOTE    Progress Note  George Walls  JYN:[id number] DOB: 1985-10-14 DOA: 10/17/2016 PCP: Patient, No Pcp Per     Brief Narrative:   George Walls is an 31 y.o. 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 Patient was obtunded but his landlord Narcan was given to him which he arose but then went back to sleep, the landlord and called EMS he received 3 doses of Narcan and was brought to the ED.  Assessment/Plan:   Acute on chronic respiratory failure with hypoxia (HCC) due to Pulmonary edema, noncardiac/Elevated troponins Improvement with Narcan. Patient did receive Lasix in the ED, had a mild elevation in troponins have trended down, and the setting of acute hypoxic respiratory failure likely demand ischemia. D-dimer was elevated due to chest compressions and probably IV drug abuse he is not tachycardic, he has no risk factors. Will DC CT scan of the chest.  Drug overdose Seems to be accidental he has access to 6 months.  Transaminitis HIV is negative. Condition does relate he has hepatitis C. We'll confirm with hepatitis panel  Dental abscesses Seen on CT maxillofacial, one inflamed left sinus, blood cultures not ordered on admission. Local head and order them at this point, currently on oral amoxicillin  DVT prophylaxis: lovenox Family Communication:none Disposition Plan/Barrier to D/C: home today Code Status:     Code Status Orders        Start     Ordered   10/17/16 1046  Full code  Continuous     10/17/16 1046    Code Status History    Date Active Date Inactive Code Status Order ID Comments User Context   This patient has a current code status but no historical code status.        IV Access:    Peripheral IV   Procedures and diagnostic studies:   Dg Chest  Portable 1 View  Result Date: 10/17/2016 CLINICAL DATA:  31 year old male with cough, shortness of breath and hypoxia status post opiate overdose. EXAM: PORTABLE CHEST 1 VIEW COMPARISON:  None. FINDINGS: Portable AP semi upright view at 0829 hours. Normal cardiac size and mediastinal contours. Visualized tracheal air column is within normal limits. No pneumothorax, pleural effusion or consolidation. However, there is patchy bilateral indistinct increased pulmonary interstitial opacity. No acute osseous abnormality identified. Paucity of bowel gas in the upper abdomen. IMPRESSION: Positive for pulmonary edema. No other acute cardiopulmonary abnormality. Electronically Signed   By: Odessa FlemingH  Hall M.D.   On: 10/17/2016 08:39   Ct Maxillofacial Wo Contrast  Result Date: 10/17/2016 CLINICAL DATA:  31 year old male with suspected left side dental infection. Left face easier and neck pain for 2 weeks. EXAM: CT MAXILLOFACIAL WITHOUT CONTRAST TECHNIQUE: Multidetector CT imaging of the maxillofacial structures was performed. Multiplanar CT image reconstructions were also generated. COMPARISON:  None. FINDINGS: Osseous: Widespread carious and poor dentition. Right maxillary and mandible posterior molars an wisdom teeth are among the worst (series 10, image 34). Prominent dental caries of the left mandible wisdom tooth, and left maxillary molar an wisdom tooth. Periapical lucency at the left maxillary wisdom tooth, and also the left maxillary canine, the root of which is in close proximity to alveolar recess mucosal thickening of the left maxillary sinus. See series 4, image 39 and series 10, image 55. No mandible osteomyelitis. No acute  facial bone fracture. Visible central skullbase and calvarium appear intact. Negative visible cervical spine. Orbits: Bilateral orbital walls are intact. Bilateral orbits soft tissues are normal. Sinuses: Bilateral tympanic cavities and mastoids are clear. The right paranasal sinuses are clear.  The left paranasal sinuses are clear aside from mild left maxillary sinus mucosal thickening, most pronounced at the alveolar recess. Rightward nasal septal deviation and spurring. Symmetric but mild bilateral nasal cavity mucosal thickening. Soft tissues: Negative noncontrast appearance of the visible larynx, pharynx, parapharyngeal spaces, retropharyngeal space, sublingual space, submandibular glands, and parotid glands. No cervical lymphadenopathy. Limited intracranial: Negative visualized noncontrast brain parenchyma. Visualized scalp soft tissues are within normal limits. IMPRESSION: 1. Poor dentition. On the left side the left maxillary canine and left maxillary posterior molar and maxillary/mandible wisdom teeth are worst affected. No periapical lucency at the canine which is in proximity to the mildly inflamed left maxillary sinus. 2. No associated soft tissue cellulitis or soft tissue abscess is evident in the absence of IV contrast. Electronically Signed   By: Odessa Fleming M.D.   On: 10/17/2016 12:01     Medical Consultants:    None.  Anti-Infectives:   Augmentin  Subjective:    George Butts UAC feels great no new complains. He also relates he has a dental appointment this week.  Objective:    Vitals:   10/17/16 2200 10/17/16 2308 10/17/16 2310 10/18/16 0335  BP:   127/70 120/64  Pulse:   94 97  Resp:   (!) 51 18  Temp:  99.1 F (37.3 C)    TempSrc:  Oral    SpO2: 97%  95% 96%  Weight:      Height:        Intake/Output Summary (Last 24 hours) at 10/18/16 0716 Last data filed at 10/17/16 1647  Gross per 24 hour  Intake              540 ml  Output                0 ml  Net              540 ml   Filed Weights   10/17/16 1234  Weight: 83.9 kg (185 lb)    Exam: General exam: In no acute distress Respiratory system: Good air movement clear to auscultation. Cardiovascular system: Regular rate and rhythm positive S1-S2 no murmurs. Gastrointestinal system:  Abdomen soft nontender nondistended. Central nervous system: Alert and oriented 3. Extremities: No lower extremity edema. Skin: No rashes or ulcerations.  Psychiatry: Judgment and insight appeared normal, mood and affect are appropriate.   Data Reviewed:    Labs: Basic Metabolic Panel:  Recent Labs Lab 10/17/16 0810 10/17/16 1356 10/18/16 0312  NA 138  --  138  K 3.7  --  3.7  CL 106  --  105  CO2 21*  --  25  GLUCOSE 149*  --  121*  BUN 17  --  19  CREATININE 0.99  --  0.85  CALCIUM 8.5*  --  8.4*  MG  --  1.8  --    GFR Estimated Creatinine Clearance: 134.1 mL/min (by C-G formula based on SCr of 0.85 mg/dL). Liver Function Tests:  Recent Labs Lab 10/17/16 1356 10/18/16 0312  AST 57* 42*  ALT 94* 73*  ALKPHOS 50 39  BILITOT 0.6 0.5  PROT 6.9 6.0*  ALBUMIN 3.8 3.3*   No results for input(s): LIPASE, AMYLASE in the last 168 hours. No results for  input(s): AMMONIA in the last 168 hours. Coagulation profile No results for input(s): INR, PROTIME in the last 168 hours.  CBC:  Recent Labs Lab 10/17/16 0810 10/18/16 0312  WBC 13.7* 11.2*  HGB 15.3 12.4*  HCT 42.6 35.8*  MCV 93.4 95.7  PLT 199 174   Cardiac Enzymes:  Recent Labs Lab 10/17/16 1356 10/17/16 1647 10/17/16 2302  TROPONINI 0.13* 0.09* 0.05*   BNP (last 3 results) No results for input(s): PROBNP in the last 8760 hours. CBG: No results for input(s): GLUCAP in the last 168 hours. D-Dimer:  Recent Labs  10/17/16 1356  DDIMER 0.55*   Hgb A1c: No results for input(s): HGBA1C in the last 72 hours. Lipid Profile: No results for input(s): CHOL, HDL, LDLCALC, TRIG, CHOLHDL, LDLDIRECT in the last 72 hours. Thyroid function studies: No results for input(s): TSH, T4TOTAL, T3FREE, THYROIDAB in the last 72 hours.  Invalid input(s): FREET3 Anemia work up: No results for input(s): VITAMINB12, FOLATE, FERRITIN, TIBC, IRON, RETICCTPCT in the last 72 hours. Sepsis Labs:  Recent Labs Lab  10/17/16 0810 10/18/16 0312  WBC 13.7* 11.2*   Microbiology Recent Results (from the past 240 hour(s))  MRSA PCR Screening     Status: None   Collection Time: 10/17/16  1:30 PM  Result Value Ref Range Status   MRSA by PCR NEGATIVE NEGATIVE Final    Comment:        The GeneXpert MRSA Assay (FDA approved for NASAL specimens only), is one component of a comprehensive MRSA colonization surveillance program. It is not intended to diagnose MRSA infection nor to guide or monitor treatment for MRSA infections.      Medications:   . amoxicillin  500 mg Oral Q8H  . enoxaparin (LOVENOX) injection  40 mg Subcutaneous Daily  . furosemide  40 mg Intravenous Once  . gabapentin  100 mg Oral Daily  . gabapentin  300 mg Oral QHS  . iopamidol      . nicotine  21 mg Transdermal Once  . nicotine  21 mg Transdermal Daily  . sertraline  200 mg Oral Daily  . vitamin A & D       Continuous Infusions:     LOS: 1 day   Marinda Elk  [facility] Pager [phone]  *Please refer to [email or web address], password [email or web address] to get updated schedule on who will round on this patient, as hospitalists switch teams weekly. If 7PM-7AM, please contact night-coverage at [email or web address], password [email or web address] for any overnight needs.  10/18/2016, 7:16 AM

## 2016-10-20 ENCOUNTER — Inpatient Hospital Stay (HOSPITAL_COMMUNITY)
Admission: EM | Admit: 2016-10-20 | Discharge: 2016-10-23 | DRG: 202 | Disposition: A | Discharge status: Home / Self Care | Payer: Self-pay | Attending: Internal Medicine | Admitting: Internal Medicine

## 2016-10-20 ENCOUNTER — Encounter (HOSPITAL_COMMUNITY): Payer: Self-pay

## 2016-10-20 ENCOUNTER — Emergency Department (HOSPITAL_COMMUNITY): Payer: Self-pay

## 2016-10-20 DIAGNOSIS — F1920 Other psychoactive substance dependence, uncomplicated: Secondary | ICD-10-CM | POA: Diagnosis present

## 2016-10-20 DIAGNOSIS — R000 Tachycardia, unspecified: Secondary | ICD-10-CM | POA: Diagnosis present

## 2016-10-20 DIAGNOSIS — B1920 Unspecified viral hepatitis C without hepatic coma: Secondary | ICD-10-CM | POA: Diagnosis present

## 2016-10-20 DIAGNOSIS — J209 Acute bronchitis, unspecified: Principal | ICD-10-CM | POA: Diagnosis present

## 2016-10-20 DIAGNOSIS — F1120 Opioid dependence, uncomplicated: Secondary | ICD-10-CM | POA: Diagnosis present

## 2016-10-20 DIAGNOSIS — R0602 Shortness of breath: Secondary | ICD-10-CM

## 2016-10-20 DIAGNOSIS — R5081 Fever presenting with conditions classified elsewhere: Secondary | ICD-10-CM

## 2016-10-20 DIAGNOSIS — F1220 Cannabis dependence, uncomplicated: Secondary | ICD-10-CM | POA: Diagnosis present

## 2016-10-20 DIAGNOSIS — Z882 Allergy status to sulfonamides status: Secondary | ICD-10-CM

## 2016-10-20 DIAGNOSIS — F1420 Cocaine dependence, uncomplicated: Secondary | ICD-10-CM | POA: Diagnosis present

## 2016-10-20 DIAGNOSIS — Z87898 Personal history of other specified conditions: Secondary | ICD-10-CM

## 2016-10-20 DIAGNOSIS — F419 Anxiety disorder, unspecified: Secondary | ICD-10-CM | POA: Diagnosis present

## 2016-10-20 DIAGNOSIS — A419 Sepsis, unspecified organism: Secondary | ICD-10-CM | POA: Diagnosis present

## 2016-10-20 DIAGNOSIS — F17210 Nicotine dependence, cigarettes, uncomplicated: Secondary | ICD-10-CM | POA: Diagnosis present

## 2016-10-20 DIAGNOSIS — F1320 Sedative, hypnotic or anxiolytic dependence, uncomplicated: Secondary | ICD-10-CM | POA: Diagnosis present

## 2016-10-20 DIAGNOSIS — Z7289 Other problems related to lifestyle: Secondary | ICD-10-CM

## 2016-10-20 DIAGNOSIS — K029 Dental caries, unspecified: Secondary | ICD-10-CM | POA: Diagnosis present

## 2016-10-20 DIAGNOSIS — R0902 Hypoxemia: Secondary | ICD-10-CM

## 2016-10-20 HISTORY — DX: Other psychoactive substance use, unspecified, uncomplicated: F19.90

## 2016-10-20 HISTORY — DX: Other psychoactive substance dependence, uncomplicated: F19.20

## 2016-10-20 MED ORDER — ALBUTEROL SULFATE (2.5 MG/3ML) 0.083% IN NEBU
5.0000 mg | INHALATION_SOLUTION | Freq: Once | RESPIRATORY_TRACT | Status: AC
  Administered 2016-10-20: 5 mg via RESPIRATORY_TRACT
  Filled 2016-10-20: qty 6

## 2016-10-20 MED ORDER — ACETAMINOPHEN 325 MG PO TABS
650.0000 mg | ORAL_TABLET | Freq: Once | ORAL | Status: AC | PRN
  Administered 2016-10-20: 650 mg via ORAL
  Filled 2016-10-20: qty 2

## 2016-10-20 MED ORDER — DEXTROSE 5 % IV SOLN
2.0000 g | Freq: Once | INTRAVENOUS | Status: AC
  Administered 2016-10-21: 2 g via INTRAVENOUS
  Filled 2016-10-20: qty 2

## 2016-10-20 MED ORDER — VANCOMYCIN HCL IN DEXTROSE 1-5 GM/200ML-% IV SOLN
1000.0000 mg | Freq: Once | INTRAVENOUS | Status: DC
  Administered 2016-10-21: 1000 mg via INTRAVENOUS
  Filled 2016-10-20: qty 200

## 2016-10-20 NOTE — ED Triage Notes (Addendum)
[facility] with complaints of SOB. Patient has history of heroine abuse- last overdose was 10/17/16. Patient denies CP. Patient denies heroine use since his last overdose. Patient diaphoretic and warm to the touch in triage. Patient reports "losing" his Augmentin prescription after leaving the hospital on 7/21. Patient reports productive cough with yellow sputum. Patient is febrile in triage.

## 2016-10-20 NOTE — Progress Notes (Signed)
Pharmacy Antibiotic Note  George ButtsChristopher Hinchey is a 31 y.o. male admitted on 10/20/2016 with HCAP.  Pharmacy has been consulted for vancomycin and cefepime dosing. Wt 83.9 kg, Temp 99.  7/23 CXR: clear.  Pt recently in hospital - admit 10/17/16 with dental infection treated with Unasyn.  Discharged on Augmentin 7/21.   Plan: Vancomycin 1000 mg IV every 8 hours.  Goal trough 15-20 mcg/mL.  Cefepime 2 gm q12h F/u renal fxn, wbc, temp, culture data vanc levels as needed  Weight: 184 lb 15.5 oz (83.9 kg)  Temp (24hrs), Avg:100.9 F (38.3 C), Min:99 F (37.2 C), Max:102.8 F (39.3 C)   Recent Labs Lab 10/17/16 0810 10/18/16 0312  WBC 13.7* 11.2*  CREATININE 0.99 0.85    Estimated Creatinine Clearance: 134.1 mL/min (by C-G formula based on SCr of 0.85 mg/dL).    Allergies  Allergen Reactions  . Sulfa Antibiotics Other (See Comments)    Reaction:  Unknown    Thank you for allowing pharmacy to be a part of this patient's care. Herby AbrahamMichelle T. Pier Bosher, Pharm.D. [phone] 10/21/2016 12:01 AM

## 2016-10-20 NOTE — ED Notes (Signed)
ED Provider at bedside. 

## 2016-10-20 NOTE — ED Provider Notes (Signed)
[facility]-EMERGENCY DEPT Provider Note   CSN: [id number] Arrival date & time: 10/20/16  2237  By signing my name below, I, Thelma Barge, attest that this documentation has been prepared under the direction and in the presence of Zykiria Bruening, MD  Electronically Signed: Thelma Barge, Scribe. 10/20/16. 11:57 PM. History   Chief Complaint Chief Complaint  Patient presents with  . Shortness of Breath  . Anxiety   The history is provided by the patient. No language interpreter was used.  Shortness of Breath  This is a new problem. The problem occurs continuously.The current episode started 2 days ago. The problem has not changed since onset.Associated symptoms include a fever (subjective), cough and sputum production. Pertinent negatives include no wheezing, no chest pain and no leg swelling. It is unknown what precipitated the problem. Risk factors include smoking. He has tried nothing for the symptoms. He has had prior hospitalizations. He has had prior ED visits. Associated medical issues include DVT.    HPI Comments: George Walls is a 31 y.o. male who presents to the Emergency Department complaining of constant, rapid-onset SOB that occurred prior to arrival. He has associated subjective fever and productive (blood and green phlegm) cough. He took tylenol PTA for his fever. Pt was seen in the ED for a heroin overdose and admitted on 10-17-16, where he was prescribed augmentin (for a dental abscess) and tramadol, but he reports losing his prescriptions. Pt's last reported narcotics use was 10-17-16. Pt is a smoker.  History reviewed. No pertinent past medical history.  Patient Active Problem List   Diagnosis Date Noted  . Transaminitis 10/18/2016  . Elevated troponin 10/18/2016  . Accidental drug overdose 10/17/2016  . Pulmonary edema, noncardiac 10/17/2016  . Acute on chronic respiratory failure with hypoxia (HCC) 10/17/2016    History reviewed. No pertinent surgical  history.     Home Medications    Prior to Admission medications   Medication Sig Start Date End Date Taking? Authorizing Provider  amoxicillin-clavulanate (AUGMENTIN) 875-125 MG tablet Take 1 tablet by mouth every 12 (twelve) hours. 10/18/16   Marinda Elk, MD  gabapentin (NEURONTIN) 100 MG capsule Take 100-300 mg by mouth 2 (two) times daily. Pt takes one in the morning and three at bedtime.    [provider]  sertraline (ZOLOFT) 100 MG tablet Take 200 mg by mouth daily.    [provider]  traMADol (ULTRAM) 50 MG tablet Take 1 tablet (50 mg total) by mouth every 6 (six) hours as needed for moderate pain. 10/18/16   Marinda Elk, MD    Family History History reviewed. No pertinent family history.  Social History Social History  Substance Use Topics  . Smoking status: Current Every Day Smoker    Packs/day: 2.00    Types: Cigarettes  . Smokeless tobacco: Never Used  . Alcohol use No     Allergies   Sulfa antibiotics   Review of Systems Review of Systems  Constitutional: Positive for fever (subjective).  Respiratory: Positive for cough, sputum production and shortness of breath. Negative for wheezing.   Cardiovascular: Negative for chest pain, palpitations and leg swelling.  All other systems reviewed and are negative.    Physical Exam Updated Vital Signs BP (!) 114/44 (BP Location: Right Arm)   Pulse (!) 123   Temp 99 F (37.2 C) (Oral)   Resp (!) 25   Wt 184 lb 15.5 oz (83.9 kg)   SpO2 95%   BMI 25.80 kg/m   Physical Exam  Constitutional: He is oriented to person, place, and time. He appears well-developed and well-nourished.  HENT:  Head: Normocephalic and atraumatic.  Mouth/Throat: Uvula is midline and oropharynx is clear and moist. No oropharyngeal exudate.  Eyes: EOM are normal.  Neck: Normal range of motion. Neck supple.  Cardiovascular: Regular rhythm, normal heart sounds and intact distal pulses.   Tachycardic No  bruits  Pulmonary/Chest: Effort normal. No respiratory distress. He has no wheezes. He has no rales.  Decreased breath sounds No stridor Decreased breathing  Abdominal: Soft. He exhibits no distension and no mass. There is no tenderness. There is no guarding.  Musculoskeletal: Normal range of motion.  Neurological: He is alert and oriented to person, place, and time.  Skin: Skin is warm and dry. Capillary refill takes less than 2 seconds.  Psychiatric: He has a normal mood and affect. Judgment normal.  Nursing note and vitals reviewed.    ED Treatments / Results   Vitals:   10/20/16 2244 10/20/16 2245  BP: (!) 114/44 (!) 114/44  Pulse: (!) 126 (!) 123  Resp: (!) 22 (!) 25  Temp: (!) 102.8 F (39.3 C) 99 F (37.2 C)    DIAGNOSTIC STUDIES: Oxygen Saturation is 95% on RA, normal by my interpretation.    COORDINATION OF CARE: 11:53 PM Discussed treatment plan with pt at bedside and pt agreed to plan.  Labs (all labs ordered are listed, but only abnormal results are displayed)  Results for orders placed or performed during the hospital encounter of 10/20/16  CBC with Differential/Platelet  Result Value Ref Range   WBC 7.3 4.0 - 10.5 K/uL   RBC 3.77 (L) 4.22 - 5.81 MIL/uL   Hemoglobin 12.7 (L) 13.0 - 17.0 g/dL   HCT 42.5 (L) 95.6 - 38.7 %   MCV 93.9 78.0 - 100.0 fL   MCH 33.7 26.0 - 34.0 pg   MCHC 35.9 30.0 - 36.0 g/dL   RDW 56.4 33.2 - 95.1 %   Platelets 167 150 - 400 K/uL   Neutrophils Relative % 69 %   Neutro Abs 5.1 1.7 - 7.7 K/uL   Lymphocytes Relative 18 %   Lymphs Abs 1.3 0.7 - 4.0 K/uL   Monocytes Relative 8 %   Monocytes Absolute 0.6 0.1 - 1.0 K/uL   Eosinophils Relative 5 %   Eosinophils Absolute 0.3 0.0 - 0.7 K/uL   Basophils Relative 0 %   Basophils Absolute 0.0 0.0 - 0.1 K/uL  Acetaminophen level  Result Value Ref Range   Acetaminophen (Tylenol), Serum <10 (L) 10 - 30 ug/mL  Salicylate level  Result Value Ref Range   Salicylate Lvl <7.0 2.8 - 30.0  mg/dL  Ethanol  Result Value Ref Range   Alcohol, Ethyl (B) <5 <5 mg/dL  Hepatic function panel  Result Value Ref Range   Total Protein 6.6 6.5 - 8.1 g/dL   Albumin 3.4 (L) 3.5 - 5.0 g/dL   AST 884 (H) 15 - 41 U/L   ALT 294 (H) 17 - 63 U/L   Alkaline Phosphatase 55 38 - 126 U/L   Total Bilirubin 2.1 (H) 0.3 - 1.2 mg/dL   Bilirubin, Direct 0.6 (H) 0.1 - 0.5 mg/dL   Indirect Bilirubin 1.5 (H) 0.3 - 0.9 mg/dL  I-Stat Chem 8, ED  Result Value Ref Range   Sodium 136 135 - 145 mmol/L   Potassium 4.2 3.5 - 5.1 mmol/L   Chloride 102 101 - 111 mmol/L   BUN 28 (H) 6 - 20 mg/dL  Creatinine, Ser 0.90 0.61 - 1.24 mg/dL   Glucose, Bld 956118 (H) 65 - 99 mg/dL   Calcium, Ion 2.131.02 (L) 1.15 - 1.40 mmol/L   TCO2 26 0 - 100 mmol/L   Hemoglobin 11.9 (L) 13.0 - 17.0 g/dL   HCT 08.635.0 (L) 57.839.0 - 46.952.0 %  I-stat troponin, ED  Result Value Ref Range   Troponin i, poc 0.04 0.00 - 0.08 ng/mL   Comment 3          I-Stat CG4 Lactic Acid, ED  Result Value Ref Range   Lactic Acid, Venous 0.95 0.5 - 1.9 mmol/L   Dg Chest 2 View  Result Date: 10/20/2016 CLINICAL DATA:  Cough and dyspnea. EXAM: CHEST  2 VIEW COMPARISON:  10/17/2016 FINDINGS: The lungs are clear. The pulmonary vasculature is normal. Heart size is normal. Hilar and mediastinal contours are unremarkable. There is no pleural effusion. IMPRESSION: No active cardiopulmonary disease. Electronically Signed   By: Ellery Plunkaniel R Mitchell M.D.   On: 10/20/2016 23:48   Dg Chest Portable 1 View  Result Date: 10/17/2016 CLINICAL DATA:  31 year old male with cough, shortness of breath and hypoxia status post opiate overdose. EXAM: PORTABLE CHEST 1 VIEW COMPARISON:  None. FINDINGS: Portable AP semi upright view at 0829 hours. Normal cardiac size and mediastinal contours. Visualized tracheal air column is within normal limits. No pneumothorax, pleural effusion or consolidation. However, there is patchy bilateral indistinct increased pulmonary interstitial opacity. No  acute osseous abnormality identified. Paucity of bowel gas in the upper abdomen. IMPRESSION: Positive for pulmonary edema. No other acute cardiopulmonary abnormality. Electronically Signed   By: Odessa FlemingH  Hall M.D.   On: 10/17/2016 08:39   Ct Maxillofacial Wo Contrast  Result Date: 10/17/2016 CLINICAL DATA:  31 year old male with suspected left side dental infection. Left face easier and neck pain for 2 weeks. EXAM: CT MAXILLOFACIAL WITHOUT CONTRAST TECHNIQUE: Multidetector CT imaging of the maxillofacial structures was performed. Multiplanar CT image reconstructions were also generated. COMPARISON:  None. FINDINGS: Osseous: Widespread carious and poor dentition. Right maxillary and mandible posterior molars an wisdom teeth are among the worst (series 10, image 34). Prominent dental caries of the left mandible wisdom tooth, and left maxillary molar an wisdom tooth. Periapical lucency at the left maxillary wisdom tooth, and also the left maxillary canine, the root of which is in close proximity to alveolar recess mucosal thickening of the left maxillary sinus. See series 4, image 39 and series 10, image 55. No mandible osteomyelitis. No acute facial bone fracture. Visible central skullbase and calvarium appear intact. Negative visible cervical spine. Orbits: Bilateral orbital walls are intact. Bilateral orbits soft tissues are normal. Sinuses: Bilateral tympanic cavities and mastoids are clear. The right paranasal sinuses are clear. The left paranasal sinuses are clear aside from mild left maxillary sinus mucosal thickening, most pronounced at the alveolar recess. Rightward nasal septal deviation and spurring. Symmetric but mild bilateral nasal cavity mucosal thickening. Soft tissues: Negative noncontrast appearance of the visible larynx, pharynx, parapharyngeal spaces, retropharyngeal space, sublingual space, submandibular glands, and parotid glands. No cervical lymphadenopathy. Limited intracranial: Negative  visualized noncontrast brain parenchyma. Visualized scalp soft tissues are within normal limits. IMPRESSION: 1. Poor dentition. On the left side the left maxillary canine and left maxillary posterior molar and maxillary/mandible wisdom teeth are worst affected. No periapical lucency at the canine which is in proximity to the mildly inflamed left maxillary sinus. 2. No associated soft tissue cellulitis or soft tissue abscess is evident in the absence of IV contrast.  Electronically Signed   By: Odessa Fleming M.D.   On: 10/17/2016 12:01    EKG  EKG Interpretation  Date/Time:  Monday October 20 2016 23:49:56 EDT Ventricular Rate:  126 PR Interval:    QRS Duration: 84 QT Interval:  306 QTC Calculation: 443 R Axis:   61 Text Interpretation:  Sinus tachycardia Confirmed by Nicanor Alcon, Kimberly Coye ([id number]) on 10/21/2016 12:10:47 AM        Procedures Procedures (including critical care time)  Medications Ordered in ED  Medications  vancomycin (VANCOCIN) IVPB 1000 mg/200 mL premix (not administered)  ceFEPIme (MAXIPIME) 2 g in dextrose 5 % 50 mL IVPB (not administered)  vancomycin (VANCOCIN) IVPB 1000 mg/200 mL premix (not administered)  albuterol (PROVENTIL) (2.5 MG/3ML) 0.083% nebulizer solution 5 mg (5 mg Nebulization Given 10/20/16 2333)  acetaminophen (TYLENOL) tablet 650 mg (650 mg Oral Given 10/20/16 2345)  ceFEPIme (MAXIPIME) 2 g in dextrose 5 % 50 mL IVPB (0 g Intravenous Stopped 10/21/16 0205)     Final Clinical Impressions(s) / ED Diagnoses   Sirs with shortness of breath: will need inpatient IV antiobiotics, hospitalist to see  I personally performed the services described in this documentation, which was scribed in my presence. The recorded information has been reviewed and is accurate.      Emmali Karow, MD 10/21/16 6045

## 2016-10-20 NOTE — ED Notes (Signed)
Bed: RESA Expected date:  Expected time:  Means of arrival:  Comments: EMS 31 yo male SOB/afebrile HR 130-recent overdose

## 2016-10-21 ENCOUNTER — Encounter (HOSPITAL_COMMUNITY): Payer: Self-pay | Admitting: Emergency Medicine

## 2016-10-21 DIAGNOSIS — K047 Periapical abscess without sinus: Secondary | ICD-10-CM

## 2016-10-21 DIAGNOSIS — R0602 Shortness of breath: Secondary | ICD-10-CM

## 2016-10-21 DIAGNOSIS — J690 Pneumonitis due to inhalation of food and vomit: Secondary | ICD-10-CM | POA: Insufficient documentation

## 2016-10-21 DIAGNOSIS — A419 Sepsis, unspecified organism: Secondary | ICD-10-CM | POA: Diagnosis present

## 2016-10-21 DIAGNOSIS — F1920 Other psychoactive substance dependence, uncomplicated: Secondary | ICD-10-CM | POA: Diagnosis present

## 2016-10-21 LAB — CBC WITH DIFFERENTIAL/PLATELET
BASOS PCT: 0 %
Basophils Absolute: 0 10*3/uL (ref 0.0–0.1)
EOS ABS: 0.3 10*3/uL (ref 0.0–0.7)
Eosinophils Relative: 5 %
HCT: 35.4 % — ABNORMAL LOW (ref 39.0–52.0)
Hemoglobin: 12.7 g/dL — ABNORMAL LOW (ref 13.0–17.0)
LYMPHS ABS: 1.3 10*3/uL (ref 0.7–4.0)
Lymphocytes Relative: 18 %
MCH: 33.7 pg (ref 26.0–34.0)
MCHC: 35.9 g/dL (ref 30.0–36.0)
MCV: 93.9 fL (ref 78.0–100.0)
MONO ABS: 0.6 10*3/uL (ref 0.1–1.0)
MONOS PCT: 8 %
Neutro Abs: 5.1 10*3/uL (ref 1.7–7.7)
Neutrophils Relative %: 69 %
Platelets: 167 10*3/uL (ref 150–400)
RBC: 3.77 MIL/uL — ABNORMAL LOW (ref 4.22–5.81)
RDW: 12.7 % (ref 11.5–15.5)
WBC: 7.3 10*3/uL (ref 4.0–10.5)

## 2016-10-21 LAB — HEPATIC FUNCTION PANEL
ALBUMIN: 3.4 g/dL — AB (ref 3.5–5.0)
ALK PHOS: 55 U/L (ref 38–126)
ALT: 294 U/L — AB (ref 17–63)
ALT: 295 U/L — ABNORMAL HIGH (ref 17–63)
AST: 207 U/L — ABNORMAL HIGH (ref 15–41)
AST: 231 U/L — AB (ref 15–41)
Albumin: 2.8 g/dL — ABNORMAL LOW (ref 3.5–5.0)
Alkaline Phosphatase: 46 U/L (ref 38–126)
BILIRUBIN TOTAL: 2.1 mg/dL — AB (ref 0.3–1.2)
Bilirubin, Direct: 0.6 mg/dL — ABNORMAL HIGH (ref 0.1–0.5)
Bilirubin, Direct: 0.7 mg/dL — ABNORMAL HIGH (ref 0.1–0.5)
Indirect Bilirubin: 0.9 mg/dL (ref 0.3–0.9)
Indirect Bilirubin: 1.5 mg/dL — ABNORMAL HIGH (ref 0.3–0.9)
Total Bilirubin: 1.6 mg/dL — ABNORMAL HIGH (ref 0.3–1.2)
Total Protein: 5.5 g/dL — ABNORMAL LOW (ref 6.5–8.1)
Total Protein: 6.6 g/dL (ref 6.5–8.1)

## 2016-10-21 LAB — I-STAT CHEM 8, ED
BUN: 28 mg/dL — ABNORMAL HIGH (ref 6–20)
CALCIUM ION: 1.02 mmol/L — AB (ref 1.15–1.40)
CREATININE: 0.9 mg/dL (ref 0.61–1.24)
Chloride: 102 mmol/L (ref 101–111)
Glucose, Bld: 118 mg/dL — ABNORMAL HIGH (ref 65–99)
HEMATOCRIT: 35 % — AB (ref 39.0–52.0)
HEMOGLOBIN: 11.9 g/dL — AB (ref 13.0–17.0)
Potassium: 4.2 mmol/L (ref 3.5–5.1)
SODIUM: 136 mmol/L (ref 135–145)
TCO2: 26 mmol/L (ref 0–100)

## 2016-10-21 LAB — STREP PNEUMONIAE URINARY ANTIGEN: STREP PNEUMO URINARY ANTIGEN: NEGATIVE

## 2016-10-21 LAB — I-STAT CG4 LACTIC ACID, ED: Lactic Acid, Venous: 0.95 mmol/L (ref 0.5–1.9)

## 2016-10-21 LAB — RAPID URINE DRUG SCREEN, HOSP PERFORMED
Amphetamines: NOT DETECTED
BENZODIAZEPINES: POSITIVE — AB
Barbiturates: NOT DETECTED
COCAINE: POSITIVE — AB
OPIATES: POSITIVE — AB
Tetrahydrocannabinol: POSITIVE — AB

## 2016-10-21 LAB — I-STAT TROPONIN, ED: Troponin i, poc: 0.04 ng/mL (ref 0.00–0.08)

## 2016-10-21 LAB — ACETAMINOPHEN LEVEL

## 2016-10-21 LAB — SALICYLATE LEVEL

## 2016-10-21 LAB — ETHANOL: Alcohol, Ethyl (B): <5 mg/dL (ref <5)

## 2016-10-21 MED ORDER — VANCOMYCIN HCL IN DEXTROSE 1-5 GM/200ML-% IV SOLN: 1000.0000 mg | Freq: Three times a day (TID) | INTRAVENOUS | Status: DC

## 2016-10-21 MED ORDER — GABAPENTIN 300 MG PO CAPS
300.0000 mg | ORAL_CAPSULE | Freq: Every day | ORAL | Status: DC
  Administered 2016-10-21 – 2016-10-22 (×2): 300 mg via ORAL
  Filled 2016-10-21 (×2): qty 1

## 2016-10-21 MED ORDER — DM-GUAIFENESIN ER 30-600 MG PO TB12
1.0000 | ORAL_TABLET | Freq: Two times a day (BID) | ORAL | Status: DC
  Administered 2016-10-21 – 2016-10-23 (×5): 1 via ORAL
  Filled 2016-10-21 (×5): qty 1

## 2016-10-21 MED ORDER — ACETAMINOPHEN 325 MG PO TABS: 650.0000 mg | ORAL_TABLET | Freq: Once | ORAL | Status: DC | PRN

## 2016-10-21 MED ORDER — ACETAMINOPHEN 325 MG PO TABS
650.0000 mg | ORAL_TABLET | Freq: Four times a day (QID) | ORAL | Status: DC | PRN
  Administered 2016-10-21: 650 mg via ORAL
  Filled 2016-10-21: qty 2

## 2016-10-21 MED ORDER — IBUPROFEN 800 MG PO TABS
800.0000 mg | ORAL_TABLET | Freq: Four times a day (QID) | ORAL | Status: DC | PRN
  Administered 2016-10-21 (×2): 800 mg via ORAL
  Filled 2016-10-21 (×2): qty 1

## 2016-10-21 MED ORDER — THIAMINE HCL 100 MG/ML IJ SOLN
100.0000 mg | Freq: Every day | INTRAMUSCULAR | Status: DC
  Filled 2016-10-21: qty 2

## 2016-10-21 MED ORDER — TRAMADOL HCL 50 MG PO TABS: 50.0000 mg | ORAL_TABLET | Freq: Four times a day (QID) | ORAL | Status: DC | PRN

## 2016-10-21 MED ORDER — LORAZEPAM 1 MG PO TABS
1.0000 mg | ORAL_TABLET | Freq: Four times a day (QID) | ORAL | Status: DC | PRN
  Administered 2016-10-22 (×2): 1 mg via ORAL
  Filled 2016-10-21 (×2): qty 1

## 2016-10-21 MED ORDER — SERTRALINE HCL 50 MG PO TABS
200.0000 mg | ORAL_TABLET | Freq: Every day | ORAL | Status: DC
  Administered 2016-10-21 – 2016-10-23 (×3): 200 mg via ORAL
  Filled 2016-10-21 (×3): qty 4

## 2016-10-21 MED ORDER — GABAPENTIN 100 MG PO CAPS
100.0000 mg | ORAL_CAPSULE | Freq: Every day | ORAL | Status: DC
  Administered 2016-10-21 – 2016-10-23 (×3): 100 mg via ORAL
  Filled 2016-10-21 (×3): qty 1

## 2016-10-21 MED ORDER — SODIUM CHLORIDE 0.9 % IV SOLN
3.0000 g | Freq: Four times a day (QID) | INTRAVENOUS | Status: DC
  Administered 2016-10-21 – 2016-10-23 (×8): 3 g via INTRAVENOUS
  Filled 2016-10-21 (×11): qty 3

## 2016-10-21 MED ORDER — GABAPENTIN 100 MG PO CAPS: 100.0000 mg | ORAL_CAPSULE | Freq: Two times a day (BID) | ORAL | Status: DC

## 2016-10-21 MED ORDER — ENOXAPARIN SODIUM 40 MG/0.4ML ~~LOC~~ SOLN
40.0000 mg | SUBCUTANEOUS | Status: DC
  Administered 2016-10-22: 40 mg via SUBCUTANEOUS
  Filled 2016-10-21 (×3): qty 0.4

## 2016-10-21 MED ORDER — VITAMIN B-1 100 MG PO TABS
100.0000 mg | ORAL_TABLET | Freq: Every day | ORAL | Status: DC
  Administered 2016-10-21 – 2016-10-23 (×3): 100 mg via ORAL
  Filled 2016-10-21 (×3): qty 1

## 2016-10-21 MED ORDER — SODIUM CHLORIDE 0.9 % IV BOLUS (SEPSIS)
500.0000 mL | Freq: Once | INTRAVENOUS | Status: AC
  Administered 2016-10-21: 500 mL via INTRAVENOUS

## 2016-10-21 MED ORDER — LORAZEPAM 2 MG/ML IJ SOLN: 1.0000 mg | Freq: Four times a day (QID) | INTRAMUSCULAR | Status: DC | PRN

## 2016-10-21 MED ORDER — DEXTROSE 5 % IV SOLN: 2.0000 g | Freq: Two times a day (BID) | INTRAVENOUS | Status: DC

## 2016-10-21 MED ORDER — SODIUM CHLORIDE 0.9 % IV SOLN
INTRAVENOUS | Status: DC
  Administered 2016-10-21 (×2): via INTRAVENOUS

## 2016-10-21 MED ORDER — FOLIC ACID 1 MG PO TABS
1.0000 mg | ORAL_TABLET | Freq: Every day | ORAL | Status: DC
  Administered 2016-10-21 – 2016-10-23 (×3): 1 mg via ORAL
  Filled 2016-10-21 (×3): qty 1

## 2016-10-21 MED ORDER — NICOTINE 21 MG/24HR TD PT24
21.0000 mg | MEDICATED_PATCH | Freq: Every day | TRANSDERMAL | Status: DC
  Administered 2016-10-21 – 2016-10-22 (×2): 21 mg via TRANSDERMAL
  Filled 2016-10-21 (×2): qty 1

## 2016-10-21 MED ORDER — ADULT MULTIVITAMIN W/MINERALS CH
1.0000 | ORAL_TABLET | Freq: Every day | ORAL | Status: DC
  Administered 2016-10-21 – 2016-10-23 (×3): 1 via ORAL
  Filled 2016-10-21 (×3): qty 1

## 2016-10-21 NOTE — Progress Notes (Signed)
LCSW consulted for: Current Substance abuse   Attempted to completed PSA/assessment. Patient's mood was flat and guarded and affect was constricted. Patient would not engage in assessment of needs or resources. Reports "I know what to do if I need help".  Reports his OD on 7/20 was not intentional as [facility] 6 months. He did not elaborate on his relapse on stated "I was stupid and messed up".  LCSW attempted to engage with motivational interviewing questions to which patient refused and declined.  No formal assessment completed due to lack to interest and participation.    Plan:  DC home, was not willing to accept resources.  Deretha EmoryHannah Yazhini Mcaulay LCSW, MSW Clinical Social Work: Optician, dispensingystem [employer] Coverage for :  [phone]

## 2016-10-21 NOTE — Progress Notes (Signed)
Pharmacy Antibiotic Note  George ButtsChristopher Walls is a 31 y.o. male admitted on 10/20/2016 with aspiration PNA.  Pharmacy has been consulted for Unasyn dosing.  Plan: Unasyn 3 gm IV q6h Pharmacy to sign off  Weight: 184 lb 15.5 oz (83.9 kg)  Temp (24hrs), Avg:101.1 F (38.4 C), Min:99 F (37.2 C), Max:102.8 F (39.3 C)   Recent Labs Lab 10/17/16 0810 10/18/16 0312 10/20/16 2352 10/21/16 0024  WBC 13.7* 11.2* 7.3  --   CREATININE 0.99 0.85  --  0.90  LATICACIDVEN  --   --   --  0.95    Estimated Creatinine Clearance: 126.7 mL/min (by C-G formula based on SCr of 0.9 mg/dL).    Allergies  Allergen Reactions  . Sulfa Antibiotics Other (See Comments)    Reaction:  Unknown     Thank you for allowing pharmacy to be a part of this patient's care.  George AbrahamMichelle T. Kahner Walls, Pharm.D. [phone] 10/21/2016 2:44 AM

## 2016-10-21 NOTE — Progress Notes (Signed)
  PROGRESS NOTE  Patient admitted earlier this morning. See H&P. Patient was recently discharged from the hospital after being admitted for heroin overdose, pulmonary edema. At that time, he was found to have dental infection and discharged home on Augmentin. He states that he lost his prescription and never got his antibiotic filled. He now presents with complaint of shortness of breath, productive cough and toothache.  Per nursing report, there was a bottle of alcohol found on patient's possession overnight. This was taken down to security. This morning, patient adamantly denies any alcohol use. We will start CIWA protocol, in case patient does go into withdrawal. UDS was positive for benzo, opiates, cocaine, THC as well.   Continue unasyn There is no inpatient dental call coverage until 7/30  Hepatitis panel pending due to elevated transaminase levels. Trend LFT Blood culture pending, if positive, may need TEE as patient is a drug abuser and increased risk of endocarditis  Ibuprofen and tylenol prn for pain  SW consulted    Noralee StainJennifer Ryane Konieczny, DO [facility] [email or web address] Password [email or web address] 10/21/2016, 11:44 AM

## 2016-10-21 NOTE — H&P (Signed)
History and Physical    George Walls ZOX:[id number] DOB: 09-30-1985 DOA: 10/20/2016  PCP: Patient, No Pcp Per  Patient coming from: Home  I have personally briefly reviewed patient's old medical records in [facility]  Chief Complaint: Cough, SOB  HPI: George Walls is a 31 y.o. male with medical history significant of Polysubstance abuse.  Patient was admitted to our service for heroin overdose on the 20th.  Pulm edema after being given narcan at that time.  Subsequently also found to have dental infection and discharged on Augmentin (which he never filled).  Patient returns to the ED with c/o SOB, cough productive of purulent sputum.  No CP, no leg swelling.  Does have fever.  Symptoms persistent and nothing makes better or worse.   ED Course: Tm 102.x.  Tachycardia improves with IVF.  UDS positive for everything except amphetamines and barbiturates.   Review of Systems: As per HPI otherwise 10 point review of systems negative.   Past Medical History:  Diagnosis Date  . IVDU (intravenous drug user)   . 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 No pertinent surgical history.   reports that he has been smoking Cigarettes.  He has been smoking about 2.00 packs per day. He has never used smokeless tobacco. He reports that he uses drugs, including Cocaine and Marijuana. He reports that he does not drink alcohol.  Allergies  Allergen Reactions  . Sulfa Antibiotics Other (See Comments)    Reaction:  Unknown     Family History  Problem Relation Age of Onset  . CAD Neg Hx   . Bleeding Disorder Neg Hx      Prior to Admission medications   Medication Sig Start Date End Date Taking? Authorizing Provider  gabapentin (NEURONTIN) 100 MG capsule Take 100-300 mg by mouth 2 (two) times daily. Pt takes one in the morning and three at bedtime.   Yes [provider]  sertraline (ZOLOFT)  100 MG tablet Take 200 mg by mouth daily.   Yes [provider]  amoxicillin-clavulanate (AUGMENTIN) 875-125 MG tablet Take 1 tablet by mouth every 12 (twelve) hours. 10/18/16   Marinda Elk, MD  traMADol (ULTRAM) 50 MG tablet Take 1 tablet (50 mg total) by mouth every 6 (six) hours as needed for moderate pain. 10/18/16   Marinda Elk, MD    Physical Exam: Vitals:   10/20/16 2242 10/20/16 2244 10/20/16 2245 10/21/16 0233  BP:  (!) 114/44 (!) 114/44 (!) 133/48  Pulse:  (!) 126 (!) 123 97  Resp:  (!) 22 (!) 25 11  Temp:  (!) 102.8 F (39.3 C) 99 F (37.2 C) (!) 101.6 F (38.7 C)  TempSrc:  Oral Oral Oral  SpO2:  92% 95% 100%  Weight: 83.9 kg (184 lb 15.5 oz)       Constitutional: NAD, calm, comfortable Eyes: PERRL, lids and conjunctivae normal ENMT: Mucous membranes are moist. Posterior pharynx clear of any exudate or lesions.Normal dentition.  Neck: normal, supple, no masses, no thyromegaly Respiratory: clear to auscultation bilaterally, no wheezing, no crackles. Normal respiratory effort. No accessory muscle use.  Cardiovascular: Regular rate and rhythm, no murmurs / rubs / gallops. No extremity edema. 2+ pedal pulses. No carotid bruits.  Abdomen: no tenderness, no masses palpated. No hepatosplenomegaly. Bowel sounds positive.  Musculoskeletal: no clubbing / cyanosis. No joint deformity upper and lower extremities. Good ROM, no contractures. Normal muscle tone.  Skin: no rashes,  lesions, ulcers. No induration Neurologic: CN 2-12 grossly intact. Sensation intact, DTR normal. Strength 5/5 in all 4.  Psychiatric: Normal judgment and insight. Alert and oriented x 3. Normal mood.    Labs on Admission: I have personally reviewed following labs and imaging studies  CBC:  Recent Labs Lab 10/17/16 0810 10/18/16 0312 10/20/16 2352 10/21/16 0024  WBC 13.7* 11.2* 7.3  --   NEUTROABS  --   --  5.1  --   HGB 15.3 12.4* 12.7* 11.9*  HCT 42.6 35.8* 35.4* 35.0*    MCV 93.4 95.7 93.9  --   PLT 199 174 167  --    Basic Metabolic Panel:  Recent Labs Lab 10/17/16 0810 10/17/16 1356 10/18/16 0312 10/21/16 0024  NA 138  --  138 136  K 3.7  --  3.7 4.2  CL 106  --  105 102  CO2 21*  --  25  --   GLUCOSE 149*  --  121* 118*  BUN 17  --  19 28*  CREATININE 0.99  --  0.85 0.90  CALCIUM 8.5*  --  8.4*  --   MG  --  1.8  --   --    GFR: Estimated Creatinine Clearance: 126.7 mL/min (by C-G formula based on SCr of 0.9 mg/dL). Liver Function Tests:  Recent Labs Lab 10/17/16 1356 10/18/16 0312 10/20/16 2352  AST 57* 42* 231*  ALT 94* 73* 294*  ALKPHOS 50 39 55  BILITOT 0.6 0.5 2.1*  PROT 6.9 6.0* 6.6  ALBUMIN 3.8 3.3* 3.4*   No results for input(s): LIPASE, AMYLASE in the last 168 hours. No results for input(s): AMMONIA in the last 168 hours. Coagulation Profile: No results for input(s): INR, PROTIME in the last 168 hours. Cardiac Enzymes:  Recent Labs Lab 10/17/16 1356 10/17/16 1647 10/17/16 2302  TROPONINI 0.13* 0.09* 0.05*   BNP (last 3 results) No results for input(s): PROBNP in the last 8760 hours. HbA1C: No results for input(s): HGBA1C in the last 72 hours. CBG: No results for input(s): GLUCAP in the last 168 hours. Lipid Profile: No results for input(s): CHOL, HDL, LDLCALC, TRIG, CHOLHDL, LDLDIRECT in the last 72 hours. Thyroid Function Tests: No results for input(s): TSH, T4TOTAL, FREET4, T3FREE, THYROIDAB in the last 72 hours. Anemia Panel: No results for input(s): VITAMINB12, FOLATE, FERRITIN, TIBC, IRON, RETICCTPCT in the last 72 hours. Urine analysis: No results found for: COLORURINE, APPEARANCEUR, LABSPEC, PHURINE, GLUCOSEU, HGBUR, BILIRUBINUR, KETONESUR, PROTEINUR, UROBILINOGEN, NITRITE, LEUKOCYTESUR  Radiological Exams on Admission: Dg Chest 2 View  Result Date: 10/20/2016 CLINICAL DATA:  Cough and dyspnea. EXAM: CHEST  2 VIEW COMPARISON:  10/17/2016 FINDINGS: The lungs are clear. The pulmonary vasculature  is normal. Heart size is normal. Hilar and mediastinal contours are unremarkable. There is no pleural effusion. IMPRESSION: No active cardiopulmonary disease. Electronically Signed   By: Ellery Plunkaniel R Mitchell M.D.   On: 10/20/2016 23:48    EKG: Independently reviewed.  Assessment/Plan Principal Problem:   Aspiration pneumonia (HCC) Active Problems:   Polysubstance dependence including opioid type drug with complication, episodic abuse (HCC)    1. Aspiration Pneumonia with SIRS - CXR neg but will treat as aspiration PNA given clinical presentation, history of possible aspiration with heroin OD admission 4 days ago.  Alternatively fever and systemic illness could be secondary to known dental abscess 1. Empiric Unasyn (also indicated for dental abscess, still needs follow up with dentist for this) 2. PNA pathway 3. IVF: NS at 125 4. Cultures pending  1. If BCx positive, would take a second look as patient probably higher risk for endocarditis too. 2. Polysubstance abuse - 1. Avoid narcotics 2. Use clonidine if he begins to go into withdrawal  DVT prophylaxis: Lovenox Code Status: Full Family Communication: No family in room Disposition Plan: Home after admit Consults called: None Admission status: Place in [place], Heywood Iles. DO [facility] Pager [phone]  If 7AM-7PM, please contact day team taking care of patient [email or web address] Password [email or web address]  10/21/2016, 3:08 AM

## 2016-10-22 DIAGNOSIS — R5081 Fever presenting with conditions classified elsewhere: Secondary | ICD-10-CM

## 2016-10-22 DIAGNOSIS — F11229 Opioid dependence with intoxication, unspecified: Secondary | ICD-10-CM

## 2016-10-22 DIAGNOSIS — J690 Pneumonitis due to inhalation of food and vomit: Secondary | ICD-10-CM

## 2016-10-22 LAB — CBC
HCT: 34.1 % — ABNORMAL LOW (ref 39.0–52.0)
Hemoglobin: 12.2 g/dL — ABNORMAL LOW (ref 13.0–17.0)
MCH: 32.6 pg (ref 26.0–34.0)
MCHC: 35.8 g/dL (ref 30.0–36.0)
MCV: 91.2 fL (ref 78.0–100.0)
PLATELETS: 157 10*3/uL (ref 150–400)
RBC: 3.74 MIL/uL — AB (ref 4.22–5.81)
RDW: 12.5 % (ref 11.5–15.5)
WBC: 4.3 10*3/uL (ref 4.0–10.5)

## 2016-10-22 LAB — COMPREHENSIVE METABOLIC PANEL
ALBUMIN: 2.9 g/dL — AB (ref 3.5–5.0)
ALT: 368 U/L — AB (ref 17–63)
AST: 221 U/L — AB (ref 15–41)
Alkaline Phosphatase: 62 U/L (ref 38–126)
Anion gap: 8 (ref 5–15)
BUN: 14 mg/dL (ref 6–20)
CHLORIDE: 109 mmol/L (ref 101–111)
CO2: 22 mmol/L (ref 22–32)
CREATININE: 0.69 mg/dL (ref 0.61–1.24)
Calcium: 8.3 mg/dL — ABNORMAL LOW (ref 8.9–10.3)
GFR calc Af Amer: >60 mL/min (ref >60)
GLUCOSE: 101 mg/dL — AB (ref 65–99)
POTASSIUM: 3.6 mmol/L (ref 3.5–5.1)
Sodium: 139 mmol/L (ref 135–145)
Total Bilirubin: 0.8 mg/dL (ref 0.3–1.2)
Total Protein: 5.8 g/dL — ABNORMAL LOW (ref 6.5–8.1)

## 2016-10-22 LAB — HIV ANTIBODY (ROUTINE TESTING W REFLEX): HIV Screen 4th Generation wRfx: NONREACTIVE

## 2016-10-22 LAB — HEPATITIS PANEL, ACUTE
HEP A IGM: NEGATIVE
Hep B C IgM: NEGATIVE
Hepatitis B Surface Ag: NEGATIVE

## 2016-10-22 MED ORDER — IPRATROPIUM-ALBUTEROL 0.5-2.5 (3) MG/3ML IN SOLN: 3.0000 mL | Freq: Four times a day (QID) | RESPIRATORY_TRACT | Status: DC | PRN

## 2016-10-22 MED ORDER — LORAZEPAM 2 MG/ML IJ SOLN
1.0000 mg | Freq: Once | INTRAMUSCULAR | Status: AC
  Administered 2016-10-23: 1 mg via INTRAVENOUS
  Filled 2016-10-22: qty 1

## 2016-10-22 MED ORDER — NICOTINE 21 MG/24HR TD PT24
21.0000 mg | MEDICATED_PATCH | Freq: Every day | TRANSDERMAL | Status: DC
  Administered 2016-10-22 – 2016-10-23 (×2): 21 mg via TRANSDERMAL
  Filled 2016-10-22 (×2): qty 1

## 2016-10-22 MED ORDER — LORAZEPAM 2 MG/ML IJ SOLN
1.0000 mg | Freq: Once | INTRAMUSCULAR | Status: AC
  Administered 2016-10-22: 1 mg via INTRAVENOUS
  Filled 2016-10-22: qty 1

## 2016-10-22 MED ORDER — IBUPROFEN 800 MG PO TABS
800.0000 mg | ORAL_TABLET | Freq: Four times a day (QID) | ORAL | Status: DC | PRN
  Administered 2016-10-22: 800 mg via ORAL
  Filled 2016-10-22: qty 1

## 2016-10-22 MED ORDER — IPRATROPIUM-ALBUTEROL 0.5-2.5 (3) MG/3ML IN SOLN
3.0000 mL | Freq: Four times a day (QID) | RESPIRATORY_TRACT | Status: DC
  Administered 2016-10-22: 3 mL via RESPIRATORY_TRACT
  Filled 2016-10-22: qty 3

## 2016-10-22 NOTE — Progress Notes (Addendum)
Pt asked if he could walk out in the hall to get out of the room, I told him that would be fine. He asked if he could go downstairs. I explained that patients cannot leave the unit per policy. He verbalized understanding. Secretary witnessed pt turning corner past the elevator and asked NT if he could go that way. NT followed pt to ask him to return to the unit, but he was nowhere to be found. AC and security alerted, and security found pt outside smoking and returned him to the unit. Security and I explained again to pt that he cannot leave the unit and next offense could result in discharge. Pt verbalized understanding. Provided pt with nicotine patch. Continue to monitor. Mick SellShannon Moksha Dorgan RN

## 2016-10-22 NOTE — Progress Notes (Addendum)
PROGRESS NOTE    George Walls  WJX:[id number] DOB: Feb 23, 1986 DOA: 10/20/2016 PCP: Patient, No Pcp Per  Brief Narrative:George Walls is a 31 y.o. male with medical history significant of Polysubstance abuse.  Patient was admitted to our service for heroin overdose on the 20th.  Subsequently also found to have dental infection and discharged on Augmentin (which he never filled). Patient returned to the ED with c/o fever, SOB, cough productive of purulent sputum. UDS positive for cocaine/opites/BZD/THC  Assessment & Plan:    Fever/Bronchitis likely vs Aspiration pneumonitis -continue Unasyn today -repeat CXR tomorrow -FU Blood Cx -add nebs today due to poor air movement, smokes daily  Dental caries -severe in the left mandilar molar and maxillary wisdom teeth -no abscess noted on maxillofacial CT 7/20 -didn't fill Abx script last admit, advised pt to FU with Dentist as outpatient -continue Abx as above, Ibuprofen    Polysubstance dependence including opioid type drug with complication, episodic abuse (HCC) -FU Blood Cx -counseled -declined resources per CSW -HIV negative, Hep C positive   Tobacco use -counseled  Elevated LFTs -due to Hep C, titres positive, ETOH, ? meds -will trend -non cholestatic pattern   DVT prophylaxis:lovenox Code Status: Full COde Family Communication: None at bedside Disposition Plan: Home tomorrow  Antimicrobials:   Unasyn   Subjective: Feels ok, some productive cough, fever down  Objective: Vitals:   10/21/16 0750 10/21/16 1304 10/21/16 2037 10/22/16 0425  BP:  129/72 138/76 132/84  Pulse:  64 68 66  Resp:  16 18 18   Temp: 99 F (37.2 C) 97.9 F (36.6 C) 98.3 F (36.8 C) 98 F (36.7 C)  TempSrc:  Oral Oral Oral  SpO2:  97% 98% 99%  Weight:      Height:        Intake/Output Summary (Last 24 hours) at 10/22/16 1337 Last data filed at 10/22/16 0346  Gross per 24 hour  Intake              420 ml  Output                 0 ml  Net              420 ml   Filed Weights   10/20/16 2242 10/21/16 0402  Weight: 83.9 kg (184 lb 15.5 oz) 86.4 kg (190 lb 7.6 oz)    Examination:  General exam: Appears calm and comfortable, no distress HEENT: poor dental hygiene Respiratory system: few scattered ronchi Cardiovascular system: S1 & S2 heard, RRR. No JVD, murmurs Gastrointestinal system: Abdomen is nondistended, soft and nontender. Normal bowel sounds heard. Central nervous system: Alert and oriented. No focal neurological deficits. Extremities: Symmetric 5 x 5 power. Skin: No rashes, lesions or ulcers Psychiatry: Judgement and insight appear normal. Mood & affect appropriate.     Data Reviewed:   CBC:  Recent Labs Lab 10/17/16 0810 10/18/16 0312 10/20/16 2352 10/21/16 0024 10/22/16 0341  WBC 13.7* 11.2* 7.3  --  4.3  NEUTROABS  --   --  5.1  --   --   HGB 15.3 12.4* 12.7* 11.9* 12.2*  HCT 42.6 35.8* 35.4* 35.0* 34.1*  MCV 93.4 95.7 93.9  --  91.2  PLT 199 174 167  --  157   Basic Metabolic Panel:  Recent Labs Lab 10/17/16 0810 10/17/16 1356 10/18/16 0312 10/21/16 0024 10/22/16 0341  NA 138  --  138 136 139  K 3.7  --  3.7 4.2 3.6  CL 106  --  105 102 109  CO2 21*  --  25  --  22  GLUCOSE 149*  --  121* 118* 101*  BUN 17  --  19 28* 14  CREATININE 0.99  --  0.85 0.90 0.69  CALCIUM 8.5*  --  8.4*  --  8.3*  MG  --  1.8  --   --   --    GFR: Estimated Creatinine Clearance: 142.5 mL/min (by C-G formula based on SCr of 0.69 mg/dL). Liver Function Tests:  Recent Labs Lab 10/17/16 1356 10/18/16 0312 10/20/16 2352 10/21/16 0635 10/22/16 0341  AST 57* 42* 231* 207* 221*  ALT 94* 73* 294* 295* 368*  ALKPHOS 50 39 55 46 62  BILITOT 0.6 0.5 2.1* 1.6* 0.8  PROT 6.9 6.0* 6.6 5.5* 5.8*  ALBUMIN 3.8 3.3* 3.4* 2.8* 2.9*   No results for input(s): LIPASE, AMYLASE in the last 168 hours. No results for input(s): AMMONIA in the last 168 hours. Coagulation Profile: No results for  input(s): INR, PROTIME in the last 168 hours. Cardiac Enzymes:  Recent Labs Lab 10/17/16 1356 10/17/16 1647 10/17/16 2302  TROPONINI 0.13* 0.09* 0.05*   BNP (last 3 results) No results for input(s): PROBNP in the last 8760 hours. HbA1C: No results for input(s): HGBA1C in the last 72 hours. CBG: No results for input(s): GLUCAP in the last 168 hours. Lipid Profile: No results for input(s): CHOL, HDL, LDLCALC, TRIG, CHOLHDL, LDLDIRECT in the last 72 hours. Thyroid Function Tests: No results for input(s): TSH, T4TOTAL, FREET4, T3FREE, THYROIDAB in the last 72 hours. Anemia Panel: No results for input(s): VITAMINB12, FOLATE, FERRITIN, TIBC, IRON, RETICCTPCT in the last 72 hours. Urine analysis: No results found for: COLORURINE, APPEARANCEUR, LABSPEC, PHURINE, GLUCOSEU, HGBUR, BILIRUBINUR, KETONESUR, PROTEINUR, UROBILINOGEN, NITRITE, LEUKOCYTESUR Sepsis Labs: @LABRCNTIP (procalcitonin:4,lacticidven:4)  ) Recent Results (from the past 240 hour(s))  MRSA PCR Screening     Status: None   Collection Time: 10/17/16  1:30 PM  Result Value Ref Range Status   MRSA by PCR NEGATIVE NEGATIVE Final    Comment:        The GeneXpert MRSA Assay (FDA approved for NASAL specimens only), is one component of a comprehensive MRSA colonization surveillance program. It is not intended to diagnose MRSA infection nor to guide or monitor treatment for MRSA infections.   Blood culture (routine x 2)     Status: None (Preliminary result)   Collection Time: 10/20/16 11:52 PM  Result Value Ref Range Status   Specimen Description BLOOD BLOOD RIGHT FOREARM  Final   Special Requests   Final    BOTTLES DRAWN AEROBIC AND ANAEROBIC Blood Culture adequate volume   Culture   Final    NO GROWTH 1 DAY Performed at [facility], [address] [place], [place] [address]    Report Status PENDING  Incomplete  Blood culture (routine x 2)     Status: None (Preliminary result)   Collection Time: 10/20/16  11:57 PM  Result Value Ref Range Status   Specimen Description BLOOD BLOOD LEFT FOREARM  Final   Special Requests IN PEDIATRIC BOTTLE Blood Culture adequate volume  Final   Culture   Final    NO GROWTH 1 DAY Performed at [facility], [address] [place], [place] [address]    Report Status PENDING  Incomplete         Radiology Studies: Dg Chest 2 View  Result Date: 10/20/2016 CLINICAL DATA:  Cough and dyspnea. EXAM: CHEST  2  VIEW COMPARISON:  10/17/2016 FINDINGS: The lungs are clear. The pulmonary vasculature is normal. Heart size is normal. Hilar and mediastinal contours are unremarkable. There is no pleural effusion. IMPRESSION: No active cardiopulmonary disease. Electronically Signed   By: Ellery Plunkaniel R Mitchell M.D.   On: 10/20/2016 23:48        Scheduled Meds: . dextromethorphan-guaiFENesin  1 tablet Oral BID  . enoxaparin (LOVENOX) injection  40 mg Subcutaneous Q24H  . folic acid  1 mg Oral Daily  . gabapentin  100 mg Oral Daily   And  . gabapentin  300 mg Oral QHS  . multivitamin with minerals  1 tablet Oral Daily  . nicotine  21 mg Transdermal Daily  . sertraline  200 mg Oral Daily  . thiamine  100 mg Oral Daily   Or  . thiamine  100 mg Intravenous Daily   Continuous Infusions: . ampicillin-sulbactam (UNASYN) IV Stopped (10/22/16 1158)     LOS: 1 day    Time spent: 35min   Zannie CovePreetha Celedonio Sortino, MD [facility] Page via [email or web address], password [email or web address]  If 7PM-7AM, please contact night-coverage [email or web address] Password [email or web address] 10/22/2016, 1:37 PM

## 2016-10-23 ENCOUNTER — Inpatient Hospital Stay (HOSPITAL_COMMUNITY): Payer: Self-pay

## 2016-10-23 DIAGNOSIS — B1920 Unspecified viral hepatitis C without hepatic coma: Secondary | ICD-10-CM | POA: Diagnosis present

## 2016-10-23 DIAGNOSIS — K029 Dental caries, unspecified: Secondary | ICD-10-CM | POA: Diagnosis present

## 2016-10-23 DIAGNOSIS — J209 Acute bronchitis, unspecified: Secondary | ICD-10-CM | POA: Diagnosis present

## 2016-10-23 LAB — COMPREHENSIVE METABOLIC PANEL
ALBUMIN: 2.9 g/dL — AB (ref 3.5–5.0)
ALK PHOS: 57 U/L (ref 38–126)
ALT: 277 U/L — AB (ref 17–63)
AST: 112 U/L — AB (ref 15–41)
Anion gap: 8 (ref 5–15)
BILIRUBIN TOTAL: 0.4 mg/dL (ref 0.3–1.2)
BUN: 15 mg/dL (ref 6–20)
CO2: 21 mmol/L — AB (ref 22–32)
CREATININE: 0.77 mg/dL (ref 0.61–1.24)
Calcium: 8.2 mg/dL — ABNORMAL LOW (ref 8.9–10.3)
Chloride: 110 mmol/L (ref 101–111)
GFR calc Af Amer: >60 mL/min (ref >60)
GFR calc non Af Amer: >60 mL/min (ref >60)
GLUCOSE: 96 mg/dL (ref 65–99)
Potassium: 3.5 mmol/L (ref 3.5–5.1)
SODIUM: 139 mmol/L (ref 135–145)
TOTAL PROTEIN: 5.8 g/dL — AB (ref 6.5–8.1)

## 2016-10-23 MED ORDER — ALBUTEROL SULFATE HFA 108 (90 BASE) MCG/ACT IN AERS
1.0000 | INHALATION_SPRAY | RESPIRATORY_TRACT | Status: DC | PRN
  Filled 2016-10-23: qty 6.7

## 2016-10-23 MED ORDER — AMOXICILLIN-POT CLAVULANATE 875-125 MG PO TABS: 1.0000 | ORAL_TABLET | Freq: Two times a day (BID) | ORAL | 0 refills | Status: DC

## 2016-10-23 MED ORDER — TRAMADOL HCL 50 MG PO TABS: 50.0000 mg | ORAL_TABLET | Freq: Four times a day (QID) | ORAL | 0 refills | Status: DC | PRN

## 2016-10-23 MED ORDER — ONDANSETRON HCL 4 MG/2ML IJ SOLN
4.0000 mg | Freq: Four times a day (QID) | INTRAMUSCULAR | Status: DC | PRN
  Administered 2016-10-23: 4 mg via INTRAVENOUS
  Filled 2016-10-23: qty 2

## 2016-10-23 MED ORDER — IBUPROFEN 400 MG PO TABS: 400.0000 mg | ORAL_TABLET | Freq: Three times a day (TID) | ORAL | Status: DC | PRN

## 2016-10-23 MED ORDER — ALBUTEROL SULFATE HFA 108 (90 BASE) MCG/ACT IN AERS: 2.0000 | INHALATION_SPRAY | Freq: Four times a day (QID) | RESPIRATORY_TRACT | 1 refills | Status: DC | PRN

## 2016-10-23 NOTE — Progress Notes (Signed)
Patient discharged to home, discharge instructions reviewed with patient who verbalized understanding. New RX given to patient and inhaler and coupons for medications. Medications returned to patient from pharmacy.

## 2016-10-23 NOTE — Discharge Summary (Signed)
Physician Discharge Summary  George Walls ZOX:[id number] DOB: 1985/09/28 DOA: 10/20/2016  PCP: Patient, No Pcp Per  Admit date: 10/20/2016 Discharge date: 10/23/2016  Time spent: 35 minutes  Recommendations for Outpatient Follow-up:  1. PCP in 1 week 2. Dentist in 2weeks 3. ID in 38month to eval for Hep C treatment   Discharge Diagnoses:  Active Problems:   Polysubstance dependence including opioid type drug with complication, episodic abuse (HCC)   Sepsis (HCC)   Hepatitis C   Bronchitis, acute   Dental caries   Discharge Condition: stable  Diet recommendation: regular  Filed Weights   10/20/16 2242 10/21/16 0402  Weight: 83.9 kg (184 lb 15.5 oz) 86.4 kg (190 lb 7.6 oz)    History of present illness:  George Walls a 31 y.o.malewith medical history significant of Polysubstance abuse. Patient was admitted to our service for heroin overdose on the 20th. Subsequently also found to have dental infection and discharged on Augmentin (which he never filled). Patient returned to the ED with c/o fever, SOB, cough productive of purulent sputum. UDS positive for cocaine/opites/BZD/THC  Hospital Course:  Fever/Bronchitis  -treated with IV Unasyn and transitioned to oral augmentin at discharge -repeat CXR today without pneumonia -Blood Cx-NGTD -albuterol inhaler used -discharged home in improved condition on augmentin  Dental caries -severe in the left mandilar molar and maxillary wisdom teeth -no abscess noted on maxillofacial CT 7/20 -didn't fill Abx script last admit, advised pt to FU with Dentist as outpatient -continue Augmentin and Ibuprofen PRN  Polysubstance dependence including opioid type drug with complication, episodic abuse (HCC) -h/o heroin, benzo, cannabis use -Blood Cx-NGTD -counseled -seen by social work, declined resources per CSW -HIV negative, Hep C positive   Tobacco use -counseled  Elevated LFTs due to hepatitis C -due to  Hep C, titres positive, ETOH,   -non cholestatic pattern -slightly improving, will need to be rechecked and advised ID follow up for Hep C treatment  Discharge Exam: Vitals:   10/23/16 0616 10/23/16 1400  BP: (!) 147/76 137/78  Pulse: 61 (!) 59  Resp: 15 16  Temp: 98.2 F (36.8 C) 98.1 F (36.7 C)    General: AAOx3 Cardiovascular: S1S2/RRR Respiratory: CTAB  Discharge Instructions   Discharge Instructions    Diet - low sodium heart healthy    Complete by:  As directed    Increase activity slowly    Complete by:  As directed      Current Discharge Medication List    START taking these medications   Details  albuterol (PROVENTIL HFA;VENTOLIN HFA) 108 (90 Base) MCG/ACT inhaler Inhale 2 puffs into the lungs every 6 (six) hours as needed for wheezing or shortness of breath. Qty: 1 Inhaler, Refills: 1    ibuprofen (ADVIL,MOTRIN) 400 MG tablet Take 1 tablet (400 mg total) by mouth every 8 (eight) hours as needed for fever or moderate pain (if insufficient response to Tylenol).      CONTINUE these medications which have CHANGED   Details  amoxicillin-clavulanate (AUGMENTIN) 875-125 MG tablet Take 1 tablet by mouth every 12 (twelve) hours. For 6days Qty: 12 tablet, Refills: 0      CONTINUE these medications which have NOT CHANGED   Details  gabapentin (NEURONTIN) 100 MG capsule Take 100-300 mg by mouth 2 (two) times daily. Pt takes one in the morning and three at bedtime.    sertraline (ZOLOFT) 100 MG tablet Take 200 mg by mouth daily.    traMADol (ULTRAM) 50 MG tablet Take 1 tablet (50  mg total) by mouth every 6 (six) hours as needed for moderate pain. Qty: 30 tablet, Refills: 0       Allergies  Allergen Reactions  . Sulfa Antibiotics Other (See Comments)    Reaction:  Unknown    Follow-up Information    [facility]              Follow up in 1 month(s).   Why:  for evaluation of Hepatitis C treatment Contact information: [address] [place] [address]       Dentist. Go in 2 week(s).            The results of significant diagnostics from this hospitalization (including imaging, microbiology, ancillary and laboratory) are listed below for reference.    Significant Diagnostic Studies: Dg Chest 2 View  Result Date: 10/23/2016 CLINICAL DATA:  Hypoxia. EXAM: CHEST  2 VIEW COMPARISON:  10/20/2016 FINDINGS: Heart is normal size. Peribronchial thickening and interstitial prominence. No confluent opacities. Trace left pleural effusion. IMPRESSION: Peribronchial thickening and interstitial prominence may reflect bronchitic changes. Trace left effusion. Electronically Signed   By: Charlett Nose M.D.   On: 10/23/2016 09:08   Dg Chest 2 View  Result Date: 10/20/2016 CLINICAL DATA:  Cough and dyspnea. EXAM: CHEST  2 VIEW COMPARISON:  10/17/2016 FINDINGS: The lungs are clear. The pulmonary vasculature is normal. Heart size is normal. Hilar and mediastinal contours are unremarkable. There is no pleural effusion. IMPRESSION: No active cardiopulmonary disease. Electronically Signed   By: Ellery Plunk M.D.   On: 10/20/2016 23:48   Dg Chest Portable 1 View  Result Date: 10/17/2016 CLINICAL DATA:  31 year old male with cough, shortness of breath and hypoxia status post opiate overdose. EXAM: PORTABLE CHEST 1 VIEW COMPARISON:  None. FINDINGS: Portable AP semi upright view at 0829 hours. Normal cardiac size and mediastinal contours. Visualized tracheal air column is within normal limits. No pneumothorax, pleural effusion or consolidation. However, there is patchy bilateral indistinct increased pulmonary interstitial opacity. No acute osseous abnormality identified. Paucity of bowel gas in the upper abdomen. IMPRESSION: Positive for pulmonary edema. No other acute cardiopulmonary abnormality. Electronically Signed   By: Odessa Fleming M.D.   On: 10/17/2016 08:39   Ct Maxillofacial Wo Contrast  Result  Date: 10/17/2016 CLINICAL DATA:  31 year old male with suspected left side dental infection. Left face easier and neck pain for 2 weeks. EXAM: CT MAXILLOFACIAL WITHOUT CONTRAST TECHNIQUE: Multidetector CT imaging of the maxillofacial structures was performed. Multiplanar CT image reconstructions were also generated. COMPARISON:  None. FINDINGS: Osseous: Widespread carious and poor dentition. Right maxillary and mandible posterior molars an wisdom teeth are among the worst (series 10, image 34). Prominent dental caries of the left mandible wisdom tooth, and left maxillary molar an wisdom tooth. Periapical lucency at the left maxillary wisdom tooth, and also the left maxillary canine, the root of which is in close proximity to alveolar recess mucosal thickening of the left maxillary sinus. See series 4, image 39 and series 10, image 55. No mandible osteomyelitis. No acute facial bone fracture. Visible central skullbase and calvarium appear intact. Negative visible cervical spine. Orbits: Bilateral orbital walls are intact. Bilateral orbits soft tissues are normal. Sinuses: Bilateral tympanic cavities and mastoids are clear. The right paranasal sinuses are clear. The left paranasal sinuses are clear aside from mild left maxillary sinus mucosal thickening, most pronounced at the alveolar recess. Rightward nasal septal deviation and spurring. Symmetric but mild bilateral nasal cavity  mucosal thickening. Soft tissues: Negative noncontrast appearance of the visible larynx, pharynx, parapharyngeal spaces, retropharyngeal space, sublingual space, submandibular glands, and parotid glands. No cervical lymphadenopathy. Limited intracranial: Negative visualized noncontrast brain parenchyma. Visualized scalp soft tissues are within normal limits. IMPRESSION: 1. Poor dentition. On the left side the left maxillary canine and left maxillary posterior molar and maxillary/mandible wisdom teeth are worst affected. No periapical lucency  at the canine which is in proximity to the mildly inflamed left maxillary sinus. 2. No associated soft tissue cellulitis or soft tissue abscess is evident in the absence of IV contrast. Electronically Signed   By: Odessa FlemingH  Hall M.D.   On: 10/17/2016 12:01    Microbiology: Recent Results (from the past 240 hour(s))  MRSA PCR Screening     Status: None   Collection Time: 10/17/16  1:30 PM  Result Value Ref Range Status   MRSA by PCR NEGATIVE NEGATIVE Final    Comment:        The GeneXpert MRSA Assay (FDA approved for NASAL specimens only), is one component of a comprehensive MRSA colonization surveillance program. It is not intended to diagnose MRSA infection nor to guide or monitor treatment for MRSA infections.   Blood culture (routine x 2)     Status: None (Preliminary result)   Collection Time: 10/20/16 11:52 PM  Result Value Ref Range Status   Specimen Description BLOOD BLOOD RIGHT FOREARM  Final   Special Requests   Final    BOTTLES DRAWN AEROBIC AND ANAEROBIC Blood Culture adequate volume   Culture   Final    NO GROWTH 2 DAYS Performed at [facility], [address] [place], [place] [address]    Report Status PENDING  Incomplete  Blood culture (routine x 2)     Status: None (Preliminary result)   Collection Time: 10/20/16 11:57 PM  Result Value Ref Range Status   Specimen Description BLOOD BLOOD LEFT FOREARM  Final   Special Requests IN PEDIATRIC BOTTLE Blood Culture adequate volume  Final   Culture   Final    NO GROWTH 2 DAYS Performed at [facility], [address] [place], [place] [address]    Report Status PENDING  Incomplete     Labs: Basic Metabolic Panel:  Recent Labs Lab 10/17/16 0810 10/17/16 1356 10/18/16 0312 10/21/16 0024 10/22/16 0341 10/23/16 0417  NA 138  --  138 136 139 139  K 3.7  --  3.7 4.2 3.6 3.5  CL 106  --  105 102 109 110  CO2 21*  --  25  --  22 21*  GLUCOSE 149*  --  121* 118* 101* 96  BUN 17  --  19 28* 14 15   CREATININE 0.99  --  0.85 0.90 0.69 0.77  CALCIUM 8.5*  --  8.4*  --  8.3* 8.2*  MG  --  1.8  --   --   --   --    Liver Function Tests:  Recent Labs Lab 10/18/16 0312 10/20/16 2352 10/21/16 0635 10/22/16 0341 10/23/16 0417  AST 42* 231* 207* 221* 112*  ALT 73* 294* 295* 368* 277*  ALKPHOS 39 55 46 62 57  BILITOT 0.5 2.1* 1.6* 0.8 0.4  PROT 6.0* 6.6 5.5* 5.8* 5.8*  ALBUMIN 3.3* 3.4* 2.8* 2.9* 2.9*   No results for input(s): LIPASE, AMYLASE in the last 168 hours. No results for input(s): AMMONIA in the last 168 hours. CBC:  Recent Labs Lab 10/17/16 0810 10/18/16  1610 10/20/16 2352 10/21/16 0024 10/22/16 0341  WBC 13.7* 11.2* 7.3  --  4.3  NEUTROABS  --   --  5.1  --   --   HGB 15.3 12.4* 12.7* 11.9* 12.2*  HCT 42.6 35.8* 35.4* 35.0* 34.1*  MCV 93.4 95.7 93.9  --  91.2  PLT 199 174 167  --  157   Cardiac Enzymes:  Recent Labs Lab 10/17/16 1356 10/17/16 1647 10/17/16 2302  TROPONINI 0.13* 0.09* 0.05*   BNP: BNP (last 3 results)  Recent Labs  10/17/16 0810  BNP 28.1    ProBNP (last 3 results) No results for input(s): PROBNP in the last 8760 hours.  CBG: No results for input(s): GLUCAP in the last 168 hours.     SignedZannie Cove MD.  [facility] 10/23/2016, 2:02 PM

## 2016-10-26 LAB — CULTURE, BLOOD (ROUTINE X 2)
CULTURE: NO GROWTH
Culture: NO GROWTH
Special Requests: ADEQUATE
Special Requests: ADEQUATE

## 2016-11-22 ENCOUNTER — Encounter (HOSPITAL_COMMUNITY): Payer: Self-pay

## 2016-11-22 DIAGNOSIS — Z79899 Other long term (current) drug therapy: Secondary | ICD-10-CM | POA: Insufficient documentation

## 2016-11-22 DIAGNOSIS — F17210 Nicotine dependence, cigarettes, uncomplicated: Secondary | ICD-10-CM | POA: Insufficient documentation

## 2016-11-22 DIAGNOSIS — F1110 Opioid abuse, uncomplicated: Secondary | ICD-10-CM | POA: Insufficient documentation

## 2016-11-22 DIAGNOSIS — Z029 Encounter for administrative examinations, unspecified: Secondary | ICD-10-CM | POA: Insufficient documentation

## 2016-11-22 DIAGNOSIS — R45851 Suicidal ideations: Secondary | ICD-10-CM | POA: Insufficient documentation

## 2016-11-22 NOTE — ED Triage Notes (Signed)
Pt here for detox.  Last heroin use today.  Has been using x 1 week, after being off heroin x 6 months.

## 2016-11-23 ENCOUNTER — Emergency Department (HOSPITAL_COMMUNITY)
Admission: EM | Admit: 2016-11-23 | Discharge: 2016-11-23 | Disposition: A | Discharge status: Home / Self Care | Payer: Self-pay | Attending: Emergency Medicine | Admitting: Emergency Medicine

## 2016-11-23 DIAGNOSIS — F1110 Opioid abuse, uncomplicated: Secondary | ICD-10-CM

## 2016-11-23 DIAGNOSIS — R45851 Suicidal ideations: Secondary | ICD-10-CM

## 2016-11-23 HISTORY — DX: Unspecified viral hepatitis C without hepatic coma: B19.20

## 2016-11-23 LAB — CBC WITH DIFFERENTIAL/PLATELET
Basophils Absolute: 0 10*3/uL (ref 0.0–0.1)
Basophils Relative: 0 %
EOS ABS: 0.2 10*3/uL (ref 0.0–0.7)
EOS PCT: 3 %
HCT: 41.5 % (ref 39.0–52.0)
Hemoglobin: 14.5 g/dL (ref 13.0–17.0)
LYMPHS ABS: 2.3 10*3/uL (ref 0.7–4.0)
Lymphocytes Relative: 33 %
MCH: 32.5 pg (ref 26.0–34.0)
MCHC: 34.9 g/dL (ref 30.0–36.0)
MCV: 93 fL (ref 78.0–100.0)
MONOS PCT: 6 %
Monocytes Absolute: 0.5 10*3/uL (ref 0.1–1.0)
Neutro Abs: 4 10*3/uL (ref 1.7–7.7)
Neutrophils Relative %: 58 %
PLATELETS: 213 10*3/uL (ref 150–400)
RBC: 4.46 MIL/uL (ref 4.22–5.81)
RDW: 12.4 % (ref 11.5–15.5)
WBC: 7.1 10*3/uL (ref 4.0–10.5)

## 2016-11-23 LAB — ACETAMINOPHEN LEVEL: Acetaminophen (Tylenol), Serum: <10 ug/mL — ABNORMAL LOW (ref 10–30)

## 2016-11-23 LAB — COMPREHENSIVE METABOLIC PANEL
ALBUMIN: 3.6 g/dL (ref 3.5–5.0)
ALK PHOS: 58 U/L (ref 38–126)
ALT: 165 U/L — ABNORMAL HIGH (ref 17–63)
AST: 88 U/L — ABNORMAL HIGH (ref 15–41)
Anion gap: 8 (ref 5–15)
BUN: 6 mg/dL (ref 6–20)
CALCIUM: 8.9 mg/dL (ref 8.9–10.3)
CO2: 25 mmol/L (ref 22–32)
CREATININE: 0.79 mg/dL (ref 0.61–1.24)
Chloride: 104 mmol/L (ref 101–111)
GFR calc Af Amer: >60 mL/min (ref >60)
GFR calc non Af Amer: >60 mL/min (ref >60)
GLUCOSE: 93 mg/dL (ref 65–99)
Potassium: 3.7 mmol/L (ref 3.5–5.1)
SODIUM: 137 mmol/L (ref 135–145)
Total Bilirubin: 0.7 mg/dL (ref 0.3–1.2)
Total Protein: 6.1 g/dL — ABNORMAL LOW (ref 6.5–8.1)

## 2016-11-23 LAB — SALICYLATE LEVEL: Salicylate Lvl: <7 mg/dL (ref 2.8–30.0)

## 2016-11-23 LAB — ETHANOL

## 2016-11-23 MED ORDER — CLONIDINE HCL 0.1 MG PO TABS: 0.1000 mg | ORAL_TABLET | Freq: Every day | ORAL | 0 refills | Status: AC

## 2016-11-23 MED ORDER — ONDANSETRON 4 MG PO TBDP: 4.0000 mg | ORAL_TABLET | Freq: Three times a day (TID) | ORAL | 0 refills | Status: DC | PRN

## 2016-11-23 MED ORDER — NAPROXEN 375 MG PO TABS
375.0000 mg | ORAL_TABLET | Freq: Two times a day (BID) | ORAL | 0 refills | Status: DC | PRN
Start: 2016-11-23 — End: 2016-11-23

## 2016-11-23 MED ORDER — LOPERAMIDE HCL 2 MG PO CAPS: 2.0000 mg | ORAL_CAPSULE | Freq: Four times a day (QID) | ORAL | 0 refills | Status: DC | PRN

## 2016-11-23 MED ORDER — LORAZEPAM 1 MG PO TABS
1.0000 mg | ORAL_TABLET | Freq: Once | ORAL | Status: AC
  Administered 2016-11-23: 1 mg via ORAL
  Filled 2016-11-23: qty 1

## 2016-11-23 MED ORDER — SERTRALINE HCL 100 MG PO TABS
200.0000 mg | ORAL_TABLET | Freq: Every day | ORAL | Status: DC
  Administered 2016-11-23: 200 mg via ORAL
  Filled 2016-11-23 (×2): qty 2

## 2016-11-23 MED ORDER — GABAPENTIN 300 MG PO CAPS
300.0000 mg | ORAL_CAPSULE | Freq: Three times a day (TID) | ORAL | Status: DC
  Administered 2016-11-23: 300 mg via ORAL
  Filled 2016-11-23: qty 1

## 2016-11-23 MED ORDER — NAPROXEN 375 MG PO TABS: 375.0000 mg | ORAL_TABLET | Freq: Two times a day (BID) | ORAL | 0 refills | Status: AC | PRN

## 2016-11-23 MED ORDER — DICYCLOMINE HCL 20 MG PO TABS: 20.0000 mg | ORAL_TABLET | Freq: Three times a day (TID) | ORAL | 0 refills | Status: DC

## 2016-11-23 MED ORDER — NICOTINE 21 MG/24HR TD PT24
21.0000 mg | MEDICATED_PATCH | Freq: Once | TRANSDERMAL | Status: DC
Start: 2016-11-23 — End: 2016-11-23
  Administered 2016-11-23: 21 mg via TRANSDERMAL
  Filled 2016-11-23: qty 1

## 2016-11-23 MED ORDER — CLONIDINE HCL 0.1 MG PO TABS: 0.1000 mg | ORAL_TABLET | Freq: Every day | ORAL | 0 refills | Status: DC

## 2016-11-23 NOTE — ED Notes (Signed)
Denies SI/HI - States "I just need somewhere to go to detox off this hard shit - no just for 30 days". When asked to specify drug using - states heroin.

## 2016-11-23 NOTE — ED Notes (Signed)
This Consulting civil engineer spoke with patient. Reiterated pod F policies as noted by [employer] previously. Advised patient that he is not being held against his will and is free to go at any time per Dr Elesa Massed; however if he would like to stay for treatment as he initially requested that he must comply with our policies and procedures to do so. Patient agreeable and got up to change. Only other request by patient at this time is for something to help his anxiety. MD Ward aware.

## 2016-11-23 NOTE — ED Notes (Signed)
States he recently moved to [place] from [place] d/t "just need to go to detox so I can get back in my house". Resources given - discussed w/pt. Verbalized understanding.

## 2016-11-23 NOTE — ED Notes (Signed)
Gave pt 2 sprits, and 2 [place] sandwiches

## 2016-11-23 NOTE — ED Notes (Signed)
UDS reordered d/t lab advised did not receive specimen. Pt aware of need for specimen - cup at bedside.

## 2016-11-23 NOTE — BH Assessment (Signed)
Pt woken up for reassessment and he falls asleep a few times during assessment. He denies SI and HI. Pt denies [facility] and no delusions noted. He reports he needs help stopping heroin use. Leighton Ruff NP recommends pt be d/c as he doesn't meet inpatient criteria. TTS will provide outpatient resources.  Evette Cristal, [place] Therapeutic Triage Specialist

## 2016-11-23 NOTE — ED Provider Notes (Signed)
TIME SEEN: 1:33 AM  CHIEF COMPLAINT: Heroin detox, suicidal thoughts  HPI: Patient is a 31 year old male with history of hepatitis C and IV heroin use who presents to the emergency department requesting heroin detox. He states that he was clean for 6 months living in [facility] and then his ex-wife had a baby with her boyfriend which upset him and he relapsed and has been using heroin for the past 2 weeks. He is requesting help with detox. He states that he has been having thoughts that he wished he was not here. No HI or hallucinations. No active suicidal thoughts. He has had a previous suicide attempt. No plan with any of these thoughts. This time he has no medical complaints.  ROS: See HPI Constitutional: no fever  Eyes: no drainage  ENT: no runny nose   Cardiovascular:  no chest pain  Resp: no SOB  GI: no vomiting or diarrhea GU: no dysuria Integumentary: no rash  Allergy: no hives  Musculoskeletal: no leg swelling  Neurological: no slurred speech ROS otherwise negative  PAST MEDICAL HISTORY/PAST SURGICAL HISTORY:  Past Medical History:  Diagnosis Date  . Hepatitis C   . IVDU (intravenous drug user)   . Polysubstance dependence including opioid type drug with complication, episodic abuse (HCC)     MEDICATIONS:  Prior to Admission medications   Medication Sig Start Date End Date Taking? Authorizing Provider  gabapentin (NEURONTIN) 100 MG capsule Take 100-300 mg by mouth 2 (two) times daily. Pt takes one in the morning and three at bedtime.   Yes [provider]  sertraline (ZOLOFT) 100 MG tablet Take 200 mg by mouth daily.   Yes [provider]    ALLERGIES:  Allergies  Allergen Reactions  . Sulfa Antibiotics Other (See Comments)    Reaction:  Unknown     SOCIAL HISTORY:  Social History  Substance Use Topics  . Smoking status: Current Every Day Smoker    Packs/day: 2.00    Types: Cigarettes  . Smokeless tobacco: Never Used  . Alcohol use No     FAMILY HISTORY: Family History  Problem Relation Age of Onset  . CAD Neg Hx   . Bleeding Disorder Neg Hx     EXAM: BP 131/81 (BP Location: Right Arm)   Pulse 85   Temp 98.3 F (36.8 C) (Oral)   Resp 18   SpO2 99%  CONSTITUTIONAL: Alert and oriented and responds appropriately to questions. Well-appearing; well-nourished HEAD: Normocephalic EYES: Conjunctivae clear, pupils appear equal, EOMI ENT: normal nose; moist mucous membranes NECK: Supple, no meningismus, no nuchal rigidity, no LAD  CARD: RRR; S1 and S2 appreciated; no murmurs, no clicks, no rubs, no gallops RESP: Normal chest excursion without splinting or tachypnea; breath sounds clear and equal bilaterally; no wheezes, no rhonchi, no rales, no hypoxia or respiratory distress, speaking full sentences ABD/GI: Normal bowel sounds; non-distended; soft, non-tender, no rebound, no guarding, no peritoneal signs, no hepatosplenomegaly BACK:  The back appears normal and is non-tender to palpation, there is no CVA tenderness EXT: Normal ROM in all joints; non-tender to palpation; no edema; normal capillary refill; no cyanosis, no calf tenderness or swelling    SKIN: Normal color for age and race; warm; no rash NEURO: Moves all extremities equally PSYCH: Patient reports passive suicidality. No plan. No HI or hallucinations.  MEDICAL DECISION MAKING: Patient here requesting detox. Also endorses passive suicidality and feeling like he does not want to live anymore. Has had a previous suicide attempt. We'll  obtain screening labs and urine in consult TTS. At this time he is here voluntarily. At this time I do not feel he needs to be involuntarily committed.  ED PROGRESS: Patient's labs show elevated AST and ALT consistent with previous labs in the setting of hepatitis C. He is medically cleared. Awaiting TTS evaluation.    3:15 AM  D/w Ala Dach with [facility].  He recommends inpt psych treatment.  Pt here voluntarily.  TTS looking for  placement. Ford agrees that it doesn't appear patient needs to be involuntarily committed. I feel patient can come in voluntarily. Patient was the decided to leave the emergency department, I feel he is safe to go home.    I reviewed all nursing notes, vitals, pertinent previous records, EKGs, lab and urine results, imaging (as available).    Ward, Layla Maw, DO 11/23/16 [phone]

## 2016-11-23 NOTE — ED Notes (Signed)
Re-TTS being performed.  

## 2016-11-23 NOTE — ED Notes (Signed)
Talk to MD about sitter at bedside for pt. MD stated she felt like he does not need a sitter.

## 2016-11-23 NOTE — ED Notes (Signed)
Dr Erma Heritage in w/pt.

## 2016-11-23 NOTE — ED Notes (Signed)
Pt notified that urine sample is needed, urinal given

## 2016-11-23 NOTE — ED Notes (Signed)
PT still has phone at this time. Pt wanted to make last phone call to family. Pt notified about rules and was given paper and pen to write name and phone numbers.

## 2016-11-23 NOTE — ED Notes (Signed)
Pt is having telepsych assessment at this time

## 2016-11-23 NOTE — ED Notes (Addendum)
Pt states he doesn't understand why he has to change into paper scrubs along with all his stuff being taken away. Pt notified about rules of being back in this holding area. Pt stated he wanted to talk to the charge nurse. Charge nurse notified about situation an is going to talk to pt.

## 2016-11-23 NOTE — BH Assessment (Signed)
Multiple attempts to connect to tele-cart 2 without success.   Harlin Rain Patsy Baltimore, LPC, [facility], [facility] Triage Specialist [phone]

## 2016-11-23 NOTE — ED Notes (Signed)
Pt had been clean for 65months, he has been using heroin again for 2 weeks.  Pt is living in [facility] (sober living facility) Pt failed his drug test and told them that he would come to get detoxed so that he can go back to live there.  Pt can not go back there unless he is sober and he states that he is from [place] but has nowhere he can go if he does not get detox, if he does nto get sober

## 2016-11-23 NOTE — ED Notes (Signed)
Patient updated on delays 
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
## 2016-11-23 NOTE — ED Provider Notes (Signed)
Assumed care this morning. The patient has been reassessed by psychiatry and does not meet inpatient criteria. He is requesting to go home. He will be provided with outpatient resources as well as prescriptions to help with opioid withdrawal. He denies any SI, HI, or auditory or visual hallucinations on my examination this morning. Labwork reviewed and is largely unremarkable. He has chronic elevation in his LFTs, likely due to hepatitis, and needs to follow up with the PCP.  This note was prepared with assistance of Conservation officer, historic buildings. Occasional wrong-word or sound-a-like substitutions may have occurred due to the inherent limitations of voice recognition software.    Shaune Pollack, MD 11/23/16 1030

## 2016-11-23 NOTE — ED Notes (Signed)
Staffing Office aware of need for sitter. 

## 2016-11-24 ENCOUNTER — Encounter (HOSPITAL_COMMUNITY): Payer: Self-pay

## 2016-11-24 ENCOUNTER — Emergency Department (HOSPITAL_COMMUNITY)
Admission: EM | Admit: 2016-11-24 | Discharge: 2016-11-25 | Disposition: A | Discharge status: Home / Self Care | Payer: Self-pay | Attending: Emergency Medicine | Admitting: Emergency Medicine

## 2016-11-24 DIAGNOSIS — F1910 Other psychoactive substance abuse, uncomplicated: Secondary | ICD-10-CM | POA: Insufficient documentation

## 2016-11-24 DIAGNOSIS — F17210 Nicotine dependence, cigarettes, uncomplicated: Secondary | ICD-10-CM | POA: Insufficient documentation

## 2016-11-24 DIAGNOSIS — F1994 Other psychoactive substance use, unspecified with psychoactive substance-induced mood disorder: Secondary | ICD-10-CM

## 2016-11-24 DIAGNOSIS — F329 Major depressive disorder, single episode, unspecified: Secondary | ICD-10-CM | POA: Insufficient documentation

## 2016-11-24 DIAGNOSIS — R45851 Suicidal ideations: Secondary | ICD-10-CM | POA: Insufficient documentation

## 2016-11-24 DIAGNOSIS — F1920 Other psychoactive substance dependence, uncomplicated: Secondary | ICD-10-CM | POA: Diagnosis present

## 2016-11-24 LAB — CBC
HEMATOCRIT: 45.5 % (ref 39.0–52.0)
HEMOGLOBIN: 16.2 g/dL (ref 13.0–17.0)
MCH: 32.9 pg (ref 26.0–34.0)
MCHC: 35.6 g/dL (ref 30.0–36.0)
MCV: 92.5 fL (ref 78.0–100.0)
Platelets: 169 10*3/uL (ref 150–400)
RBC: 4.92 MIL/uL (ref 4.22–5.81)
RDW: 12.4 % (ref 11.5–15.5)
WBC: 10.1 10*3/uL (ref 4.0–10.5)

## 2016-11-24 LAB — RAPID URINE DRUG SCREEN, HOSP PERFORMED
Amphetamines: NOT DETECTED
BARBITURATES: NOT DETECTED
Benzodiazepines: NOT DETECTED
COCAINE: POSITIVE — AB
Opiates: POSITIVE — AB
TETRAHYDROCANNABINOL: NOT DETECTED

## 2016-11-24 LAB — COMPREHENSIVE METABOLIC PANEL
ALBUMIN: 3.8 g/dL (ref 3.5–5.0)
ALT: 187 U/L — AB (ref 17–63)
ANION GAP: 10 (ref 5–15)
AST: 102 U/L — ABNORMAL HIGH (ref 15–41)
Alkaline Phosphatase: 71 U/L (ref 38–126)
BUN: 14 mg/dL (ref 6–20)
CHLORIDE: 108 mmol/L (ref 101–111)
CO2: 19 mmol/L — AB (ref 22–32)
Calcium: 8.7 mg/dL — ABNORMAL LOW (ref 8.9–10.3)
Creatinine, Ser: 0.99 mg/dL (ref 0.61–1.24)
GFR calc non Af Amer: >60 mL/min (ref >60)
Glucose, Bld: 114 mg/dL — ABNORMAL HIGH (ref 65–99)
Potassium: 4.1 mmol/L (ref 3.5–5.1)
SODIUM: 137 mmol/L (ref 135–145)
Total Bilirubin: 1.2 mg/dL (ref 0.3–1.2)
Total Protein: 6.9 g/dL (ref 6.5–8.1)

## 2016-11-24 LAB — ACETAMINOPHEN LEVEL

## 2016-11-24 LAB — ETHANOL: Alcohol, Ethyl (B): <5 mg/dL (ref <5)

## 2016-11-24 LAB — SALICYLATE LEVEL

## 2016-11-24 MED ORDER — ALUM & MAG HYDROXIDE-SIMETH 200-200-20 MG/5ML PO SUSP: 30.0000 mL | Freq: Four times a day (QID) | ORAL | Status: DC | PRN

## 2016-11-24 MED ORDER — LOPERAMIDE HCL 2 MG PO CAPS: 2.0000 mg | ORAL_CAPSULE | Freq: Four times a day (QID) | ORAL | Status: DC | PRN

## 2016-11-24 MED ORDER — SERTRALINE HCL 50 MG PO TABS
200.0000 mg | ORAL_TABLET | Freq: Every day | ORAL | Status: DC
  Administered 2016-11-25: 200 mg via ORAL
  Filled 2016-11-24: qty 4

## 2016-11-24 MED ORDER — NAPROXEN 375 MG PO TABS
375.0000 mg | ORAL_TABLET | Freq: Two times a day (BID) | ORAL | Status: DC | PRN
  Filled 2016-11-24: qty 1

## 2016-11-24 MED ORDER — DICYCLOMINE HCL 20 MG PO TABS
20.0000 mg | ORAL_TABLET | Freq: Three times a day (TID) | ORAL | Status: DC
  Administered 2016-11-25: 20 mg via ORAL
  Filled 2016-11-24: qty 1

## 2016-11-24 MED ORDER — ZOLPIDEM TARTRATE 5 MG PO TABS
5.0000 mg | ORAL_TABLET | Freq: Every evening | ORAL | Status: DC | PRN
  Administered 2016-11-25: 5 mg via ORAL
  Filled 2016-11-24: qty 1

## 2016-11-24 MED ORDER — ONDANSETRON 4 MG PO TBDP: 4.0000 mg | ORAL_TABLET | Freq: Three times a day (TID) | ORAL | Status: DC | PRN

## 2016-11-24 MED ORDER — GABAPENTIN 100 MG PO CAPS
100.0000 mg | ORAL_CAPSULE | Freq: Two times a day (BID) | ORAL | Status: DC
Start: 2016-11-24 — End: 2016-11-25
  Administered 2016-11-25: 300 mg via ORAL
  Administered 2016-11-25: 100 mg via ORAL
  Filled 2016-11-24: qty 1
  Filled 2016-11-24: qty 3

## 2016-11-24 NOTE — ED Triage Notes (Signed)
Patient has been changed into scrubs and wanded

## 2016-11-24 NOTE — ED Notes (Signed)
Report to [facility]-to [facility] with book bag and bag of belongings

## 2016-11-24 NOTE — ED Triage Notes (Signed)
Patient states he stopped taking his Zoloft 1 week ago-states "I just got busy with life". Patient states he just moved here from [place] and was getting his Rx through [facility] here doesn't do outpatient. Patient states depressed and feels suicidal with no plan or intent. Patient states he called 911 himself to get help. Patient states no family support-lives with other people he met after moving here. Patient states he does have family in [place].

## 2016-11-24 NOTE — ED Provider Notes (Signed)
TIME SEEN: 11:40 PM  CHIEF COMPLAINT: suicidal  HPI: patient is a 31 year old male with history of hepatitis C, IV drug abuse, polysubstance abuse who presents emergency department after just recently being discharged for suicidal thoughts. Reports that he last drank alcohol a day ago, used cocaine 4 days ago and heroin 2 days ago. He is suicidal without a plan. He has had previous suicide attempts. Has no medical complaints at this time including new pain, fevers, cough, vomiting, diarrhea.  ROS: See HPI Constitutional: no fever  Eyes: no drainage  ENT: no runny nose   Cardiovascular:  no chest pain  Resp: no SOB  GI: no vomiting GU: no dysuria Integumentary: no rash  Allergy: no hives  Musculoskeletal: no leg swelling  Neurological: no slurred speech ROS otherwise negative  PAST MEDICAL HISTORY/PAST SURGICAL HISTORY:  Past Medical History:  Diagnosis Date  . Hepatitis C   . IVDU (intravenous drug user)   . Polysubstance dependence including opioid type drug with complication, episodic abuse (HCC)     MEDICATIONS:  Prior to Admission medications   Medication Sig Start Date End Date Taking? Authorizing Provider  cloNIDine (CATAPRES) 0.1 MG tablet Take 1 tablet (0.1 mg total) by mouth daily. For withdrawal symptoms 11/23/16 11/28/16  Shaune Pollack, MD  dicyclomine (BENTYL) 20 MG tablet Take 1 tablet (20 mg total) by mouth 4 (four) times daily -  before meals and at bedtime. 11/23/16 11/28/16  Shaune Pollack, MD  gabapentin (NEURONTIN) 100 MG capsule Take 100-300 mg by mouth 2 (two) times daily. Pt takes one in the morning and three at bedtime.    [provider]  loperamide (IMODIUM) 2 MG capsule Take 1 capsule (2 mg total) by mouth 4 (four) times daily as needed for diarrhea or loose stools. 11/23/16   Shaune Pollack, MD  naproxen (NAPROSYN) 375 MG tablet Take 1 tablet (375 mg total) by mouth 2 (two) times daily as needed for moderate pain. 11/23/16 11/30/16  Shaune Pollack,  MD  ondansetron (ZOFRAN ODT) 4 MG disintegrating tablet Take 1 tablet (4 mg total) by mouth every 8 (eight) hours as needed for nausea or vomiting. 11/23/16   Shaune Pollack, MD  sertraline (ZOLOFT) 100 MG tablet Take 200 mg by mouth daily.    [provider]    ALLERGIES:  Allergies  Allergen Reactions  . Sulfa Antibiotics Other (See Comments)    Reaction:  Unknown     SOCIAL HISTORY:  Social History  Substance Use Topics  . Smoking status: Current Every Day Smoker    Packs/day: 2.00    Types: Cigarettes  . Smokeless tobacco: Never Used  . Alcohol use No    FAMILY HISTORY: Family History  Problem Relation Age of Onset  . CAD Neg Hx   . Bleeding Disorder Neg Hx     EXAM: BP 117/80 (BP Location: Left Arm)   Pulse (!) 113   Temp 98.6 F (37 C) (Oral)   Resp 16   SpO2 97%  CONSTITUTIONAL: Alert and oriented and responds appropriately to questions.chronically ill-appearing, afebrile, nontoxic HEAD: Normocephalic EYES: Conjunctivae clear, pupils appear equal, EOMI ENT: normal nose; moist mucous membranes NECK: Supple, no meningismus, no nuchal rigidity, no LAD  CARD: RRR; S1 and S2 appreciated; no murmurs, no clicks, no rubs, no gallops RESP: Normal chest excursion without splinting or tachypnea; breath sounds clear and equal bilaterally; no wheezes, no rhonchi, no rales, no hypoxia or respiratory distress, speaking full sentences ABD/GI: Normal bowel sounds; non-distended; soft, non-tender,  no rebound, no guarding, no peritoneal signs, no hepatosplenomegaly BACK:  The back appears normal and is non-tender to palpation, there is no CVA tenderness EXT: Normal ROM in all joints; non-tender to palpation; no edema; normal capillary refill; no cyanosis, no calf tenderness or swelling    SKIN: Normal color for age and race; warm; no rash NEURO: Moves all extremities equally PSYCH: endorses SI without a plan. No HI or hallucinations.  MEDICAL DECISION MAKING: Patient  here with suicidal thoughts. Unable to contract for safety. No plan. Was just seen here in the [facility] emergency department several days ago for the same and was discharged after seen TTS. Patient states he feels he needs inpatient psychiatric treatment. I will reconsult TTS. Screening labs unremarkable other than mildly elevated AST and ALT which appear to be his baseline and likely from hepatitis C. Abdominal exam is benign.  ED PROGRESS:    12:30 AM  D/w TTS.  They recommend AM psych eval.  I agree with this plan. Patient is here voluntarily.   I reviewed all nursing notes, vitals, pertinent previous records, EKGs, lab and urine results, imaging (as available).     Beldon Nowling, Layla Maw, DO 11/25/16 [phone]

## 2016-11-25 DIAGNOSIS — F1994 Other psychoactive substance use, unspecified with psychoactive substance-induced mood disorder: Secondary | ICD-10-CM

## 2016-11-25 DIAGNOSIS — F1490 Cocaine use, unspecified, uncomplicated: Secondary | ICD-10-CM

## 2016-11-25 DIAGNOSIS — F17210 Nicotine dependence, cigarettes, uncomplicated: Secondary | ICD-10-CM

## 2016-11-25 DIAGNOSIS — F1290 Cannabis use, unspecified, uncomplicated: Secondary | ICD-10-CM

## 2016-11-25 MED ORDER — SERTRALINE HCL 50 MG PO TABS: 50.0000 mg | ORAL_TABLET | Freq: Every day | ORAL | Status: DC

## 2016-11-25 MED ORDER — GABAPENTIN 100 MG PO CAPS: 200.0000 mg | ORAL_CAPSULE | Freq: Two times a day (BID) | ORAL | Status: DC

## 2016-11-25 NOTE — ED Notes (Signed)
Introduced self to patient. Pt oriented to unit expectations.  Assessed pt for:  A) Anxiety &/or agitation: Pt has been calm and cooperative. 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 Jannifer Franklin that he has been feeling suicidal lately. He has not been taking Zoloft, but has been using heroin and cocaine IV. He moved to [place] recently for [place] and does not have a PCP here. He plans to stay in [place]. He said, "I like it here."   S) Safety: Safety maintained with q-15-minute checks and hourly rounds by staff.  A) ADLs: Pt able to perform ADLs independently.  P) Pick-Up (room cleanliness): Pt's room clean and free of clutter.

## 2016-11-25 NOTE — ED Notes (Signed)
Patient continues to endorse depression and SI stated "I still feel like not existing". Patient verbally contracted for safety. Denies pain, SI/HI, AH/VH at this moment.  Staff offered support and encouraged patient to verba;ize needs to staff. Routine safety checks maintained. Will continue to monitor patient.

## 2016-11-25 NOTE — BH Assessment (Signed)
[facility] Assessment Progress Note  Per Mojeed Akintayo, MD, this pt does not require psychiatric hospitalization at this time.  Pt is to be discharged from [facility] with recommendation to [facility] with [facility].  This has been included in pt's discharge instructions.  Pt's nurse has been notified.  Naria Abbey, MA Triage Specialist [phone]     

## 2016-11-25 NOTE — Discharge Instructions (Signed)
To help you maintain a sober lifestyle, a substance abuse treatment program may be beneficial to you.  Contact [facility] at your earliest opportunity to ask about enrolling in their program: ° °     [facility] ([facility] °     [address] °     [place], [place] [address] °     [phone] °     New patients are seen at the walk-in clinic every Tuesday from 9:00 am - 12:00 pm °

## 2016-11-25 NOTE — BHH Suicide Risk Assessment (Signed)
Suicide Risk Assessment    [facility] Discharge Suicide Risk Assessment   Principal Problem: Substance induced mood disorder [facility]) Discharge Diagnoses:  Patient Active Problem List   Diagnosis Date Noted  . Substance induced mood disorder (HCC) [F19.94] 11/25/2016  . Hepatitis C [B19.20] 10/23/2016  . Bronchitis, acute [J20.9] 10/23/2016  . Dental caries [K02.9] 10/23/2016  . Polysubstance dependence including opioid type drug with complication, episodic abuse (HCC) [A83.419] 10/21/2016  . Aspiration pneumonia (HCC) [J69.0] 10/21/2016  . Sepsis (HCC) [A41.9] 10/21/2016  . Transaminitis [R74.0] 10/18/2016  . Elevated troponin [R74.8] 10/18/2016  . Accidental drug overdose [T50.901A] 10/17/2016  . Pulmonary edema, noncardiac [J81.1] 10/17/2016  . Acute on chronic respiratory failure with hypoxia (HCC) [J96.21] 10/17/2016    Total Time spent with patient: 30 minutes  Musculoskeletal: Strength & Muscle Tone: within normal limits Gait & Station: normal Patient leans: N/A  Psychiatric Specialty Exam:   Blood pressure 122/60, pulse 60, temperature 98.2 F (36.8 C), temperature source Oral, resp. rate 18, SpO2 97 %.There is no height or weight on file to calculate BMI.  General Appearance: Casual  Eye Contact::  Fair  Speech:  Clear and Coherent  Volume:  Normal  Mood:  Irritable  Affect:  Congruent  Thought Process:  Coherent  Orientation:  Full (Time, Place, and Person)  Thought Content:  Hallucinations: None  Suicidal Thoughts:  No  Homicidal Thoughts:  No  Memory:  Recent;   Good Remote;   Good  Judgement:  Good  Insight:  Fair  Psychomotor Activity:  Normal  Concentration:  Fair  Recall:  Good  Fund of Knowledge:Fair  Language: Good  Akathisia:  No  Handed:  Right  AIMS (if indicated):     Assets:  Desire for Improvement Resilience Social Support  Sleep:     Cognition: WNL  ADL's:  Intact   Mental Status Per Nursing Assessment::   On Admission:     Demographic  Factors:  Male and Low socioeconomic status  Loss Factors: Financial problems/change in socioeconomic status  Historical Factors: Family history of mental illness or substance abuse  Risk Reduction Factors:   Living with another person, especially a relative and Positive therapeutic relationship  Continued Clinical Symptoms:  Previous Psychiatric Diagnoses and Treatments  Cognitive Features That Contribute To Risk:  Closed-mindedness    Suicide Risk:  Minimal: No identifiable suicidal ideation.  Patients presenting with no risk factors but with morbid ruminations; may be classified as minimal risk based on the severity of the depressive symptoms    Plan Of Care/Follow-up recommendations:  Activity:  as tolarted Diet:  heart healthy  Other:  patient to provided resources to [facility]   Oneta Rack, NP 11/25/2016, 11:20 AM

## 2016-11-25 NOTE — ED Notes (Signed)
Pt discharged home. Discharged instructions read to pt who verbalized understanding. All belongings returned to pt who signed for same. Denies SI/HI, is not delusional and not responding to internal stimuli. Escorted pt to the ED exit.    

## 2016-11-25 NOTE — Consult Note (Signed)
[facility] Consult   Reason for Consult:  Substance Abuse Referring Physician:  EPD Patient Identification: George Walls MRN:  [id number] Principal Diagnosis: Substance induced mood disorder ([place]) Diagnosis:   Patient Active Problem List   Diagnosis Date Noted  . Substance induced mood disorder ([place]) [F19.94] 11/25/2016  . Hepatitis C [B19.20] 10/23/2016  . Bronchitis, acute [J20.9] 10/23/2016  . Dental caries [K02.9] 10/23/2016  . Polysubstance dependence including opioid type drug with complication, episodic abuse ([place]) [S34.196] 10/21/2016  . Aspiration pneumonia ([place]) [J69.0] 10/21/2016  . Sepsis ([place]) [A41.9] 10/21/2016  . Transaminitis [R74.0] 10/18/2016  . Elevated troponin [R74.8] 10/18/2016  . Accidental drug overdose [T50.901A] 10/17/2016  . Pulmonary edema, noncardiac [J81.1] 10/17/2016  . Acute on chronic respiratory failure with hypoxia (HCC) [J96.21] 10/17/2016    Total Time spent with patient: 30 minutes  Subjective:   George Walls is a 31 y.o. male patient admitted with substance abuse. George Walls is awake, alert and oriented X4. Seen resting in bedroom. Patient reports a recent move to [place] from [place]. Reports he was followed by [facility] in [place]. Patient reports he attempted to go to [facility] however was unable to get his medication at that time.   Patient is requesting information for substance abuse and to get connected with [facility] for medication refills ( Zoloft). Patient reports using heroin and cocaine, however would like help to stop using. Denies suicidal or homicidal ideation during this assessment. Denies auditory or visual hallucination and does not appear to be responding to internal stimuli. Support, encouragement and reassurance was provided.   HPI:    Past Psychiatric History:  Risk to Self: Suicidal Ideation: Not Currently  Suicidal Intent: Not Currently  Is patient at risk for  suicide?: denies Suicidal Plan?: Not Currently  Specify Current Suicidal Plan: none Access to Means: denies Specify Access to Suicidal Means: denies  What has been your use of drugs/alcohol within the last 12 months?: opiates, cocaine.  How many times?: 3 (Per chart, pt denies. ) Other Self Harm Risks: Pt denies.  Triggers for Past Attempts: None known Intentional Self Injurious Behavior: None (Pt denies. ) Risk to Others: Homicidal Ideation: No (Pt denies. ) Thoughts of Harm to Others: No Current Homicidal Intent: No Current Homicidal Plan: No Access to Homicidal Means: No Identified Victim: NA History of harm to others?: No Assessment of Violence: None Noted Violent Behavior Description: NA Does patient have access to weapons?: No (Pt denies. ) Criminal Charges Pending?: No Does patient have a court date: No Prior Inpatient Therapy: Prior Inpatient Therapy: Yes Prior Therapy Dates: 03/2016, multiple admits Prior Therapy Facilty/Provider(s): Facility in [place] Reason for Treatment: Depression, substance use Prior Outpatient Therapy: Prior Outpatient Therapy: No (Pt denies. ) Prior Therapy Dates: NA Prior Therapy Facilty/Provider(s): NA Reason for Treatment: NA Does patient have an ACCT team?: No Does patient have Intensive In-House Services?  : No Does patient have [facility] services? : No Does patient have [facility] services?: No  Past Medical History:  Past Medical History:  Diagnosis Date  . Hepatitis C   . IVDU (intravenous drug user)   . Polysubstance dependence including opioid type drug with complication, episodic abuse ([place])    History reviewed. No pertinent surgical history. Family History:  Family History  Problem Relation Age of Onset  . CAD Neg Hx   . Bleeding Disorder Neg Hx    Family Psychiatric  History:  Social History:  History  Alcohol Use No     History  Drug Use  . Types: Cocaine, Marijuana    Comment: last heroin 11-22-16; cocaine 11-18-16     Social History   Social History  . Marital status: Divorced    Spouse name: N/A  . Number of children: N/A  . Years of education: N/A   Social History Main Topics  . Smoking status: Current Every Day Smoker    Packs/day: 2.00    Types: Cigarettes  . Smokeless tobacco: Never Used  . Alcohol use No  . Drug use: Yes    Types: Cocaine, Marijuana     Comment: last heroin 11-22-16; cocaine 11-18-16  . Sexual activity: Not Asked   Other Topics Concern  . None   Social History Narrative  . None   Additional Social History:    Allergies:   Allergies  Allergen Reactions  . Sulfa Antibiotics Other (See Comments)    Reaction:  Unknown     Labs:  Results for orders placed or performed during the hospital encounter of 11/24/16 (from the past 48 hour(s))  Comprehensive metabolic panel     Status: Abnormal   Collection Time: 11/24/16 10:42 PM  Result Value Ref Range   Sodium 137 135 - 145 mmol/L   Potassium 4.1 3.5 - 5.1 mmol/L   Chloride 108 101 - 111 mmol/L   CO2 19 (L) 22 - 32 mmol/L   Glucose, Bld 114 (H) 65 - 99 mg/dL   BUN 14 6 - 20 mg/dL   Creatinine, Ser 0.99 0.61 - 1.24 mg/dL   Calcium 8.7 (L) 8.9 - 10.3 mg/dL   Total Protein 6.9 6.5 - 8.1 g/dL   Albumin 3.8 3.5 - 5.0 g/dL   AST 102 (H) 15 - 41 U/L   ALT 187 (H) 17 - 63 U/L   Alkaline Phosphatase 71 38 - 126 U/L   Total Bilirubin 1.2 0.3 - 1.2 mg/dL   GFR calc non Af Amer >60 >60 mL/min   GFR calc Af Amer >60 >60 mL/min    Comment: (NOTE) The eGFR has been calculated using the CKD EPI equation. This calculation has not been validated in all clinical situations. eGFR's persistently <60 mL/min signify possible Chronic Kidney Disease.    Anion gap 10 5 - 15  Ethanol     Status: None   Collection Time: 11/24/16 10:42 PM  Result Value Ref Range   Alcohol, Ethyl (B) <5 <5 mg/dL    Comment:        LOWEST DETECTABLE LIMIT FOR SERUM ALCOHOL IS 5 mg/dL FOR MEDICAL PURPOSES ONLY   Salicylate level     Status:  None   Collection Time: 11/24/16 10:42 PM  Result Value Ref Range   Salicylate Lvl <2.5 2.8 - 30.0 mg/dL  Acetaminophen level     Status: Abnormal   Collection Time: 11/24/16 10:42 PM  Result Value Ref Range   Acetaminophen (Tylenol), Serum <10 (L) 10 - 30 ug/mL    Comment:        THERAPEUTIC CONCENTRATIONS VARY SIGNIFICANTLY. A RANGE OF 10-30 ug/mL MAY BE AN EFFECTIVE CONCENTRATION FOR MANY PATIENTS. HOWEVER, SOME ARE BEST TREATED AT CONCENTRATIONS OUTSIDE THIS RANGE. ACETAMINOPHEN CONCENTRATIONS >150 ug/mL AT 4 HOURS AFTER INGESTION AND >50 ug/mL AT 12 HOURS AFTER INGESTION ARE OFTEN ASSOCIATED WITH TOXIC REACTIONS.   cbc     Status: None   Collection Time: 11/24/16 10:42 PM  Result Value Ref Range   WBC 10.1 4.0 - 10.5 K/uL   RBC 4.92 4.22 - 5.81 MIL/uL  Hemoglobin 16.2 13.0 - 17.0 g/dL   HCT 45.5 39.0 - 52.0 %   MCV 92.5 78.0 - 100.0 fL   MCH 32.9 26.0 - 34.0 pg   MCHC 35.6 30.0 - 36.0 g/dL   RDW 12.4 11.5 - 15.5 %   Platelets 169 150 - 400 K/uL  Rapid urine drug screen (hospital performed)     Status: Abnormal   Collection Time: 11/24/16 10:42 PM  Result Value Ref Range   Opiates POSITIVE (A) NONE DETECTED   Cocaine POSITIVE (A) NONE DETECTED   Benzodiazepines NONE DETECTED NONE DETECTED   Amphetamines NONE DETECTED NONE DETECTED   Tetrahydrocannabinol NONE DETECTED NONE DETECTED   Barbiturates NONE DETECTED NONE DETECTED    Comment:        DRUG SCREEN FOR MEDICAL PURPOSES ONLY.  IF CONFIRMATION IS NEEDED FOR ANY PURPOSE, NOTIFY LAB WITHIN 5 DAYS.        LOWEST DETECTABLE LIMITS FOR URINE DRUG SCREEN Drug Class       Cutoff (ng/mL) Amphetamine      1000 Barbiturate      200 Benzodiazepine   086 Tricyclics       578 Opiates          300 Cocaine          300 THC              50     Current Facility-Administered Medications  Medication Dose Route Frequency Provider Last Rate Last Dose  . alum & mag hydroxide-simeth (MAALOX/MYLANTA) 200-200-20 MG/5ML  suspension 30 mL  30 mL Oral Q6H PRN Ward, Kristen N, DO      . dicyclomine (BENTYL) tablet 20 mg  20 mg Oral TID AC & HS Ward, Kristen N, DO   20 mg at 11/25/16 0751  . gabapentin (NEURONTIN) capsule 200 mg  200 mg Oral BID Courtney Bellizzi, MD      . loperamide (IMODIUM) capsule 2 mg  2 mg Oral QID PRN Ward, Kristen N, DO      . naproxen (NAPROSYN) tablet 375 mg  375 mg Oral BID PRN Ward, Kristen N, DO      . ondansetron (ZOFRAN-ODT) disintegrating tablet 4 mg  4 mg Oral Q8H PRN Ward, Kristen N, DO      . [START ON 11/26/2016] sertraline (ZOLOFT) tablet 50 mg  50 mg Oral Daily Michelina Mexicano, MD       Current Outpatient Prescriptions  Medication Sig Dispense Refill  . cloNIDine (CATAPRES) 0.1 MG tablet Take 1 tablet (0.1 mg total) by mouth daily. For withdrawal symptoms 5 tablet 0  . dicyclomine (BENTYL) 20 MG tablet Take 1 tablet (20 mg total) by mouth 4 (four) times daily -  before meals and at bedtime. 20 tablet 0  . gabapentin (NEURONTIN) 100 MG capsule Take 100-300 mg by mouth 2 (two) times daily. Pt takes one in the morning and three at bedtime.    Marland Kitchen loperamide (IMODIUM) 2 MG capsule Take 1 capsule (2 mg total) by mouth 4 (four) times daily as needed for diarrhea or loose stools. 12 capsule 0  . naproxen (NAPROSYN) 375 MG tablet Take 1 tablet (375 mg total) by mouth 2 (two) times daily as needed for moderate pain. 20 tablet 0  . ondansetron (ZOFRAN ODT) 4 MG disintegrating tablet Take 1 tablet (4 mg total) by mouth every 8 (eight) hours as needed for nausea or vomiting. 20 tablet 0  . sertraline (ZOLOFT) 100 MG tablet Take 200 mg by mouth  daily.      Musculoskeletal: Strength & Muscle Tone: within normal limits Gait & Station: normal Patient leans: N/A  Psychiatric Specialty Exam: Physical Exam  Vitals reviewed. Constitutional: He appears well-developed.  Cardiovascular: Normal rate.   Neurological: He is alert.  Psychiatric: He has a normal mood and affect. His behavior is  normal.    Review of Systems  Psychiatric/Behavioral: Positive for substance abuse. Negative for depression (stable) and suicidal ideas. The patient is not nervous/anxious.     Blood pressure 122/60, pulse 60, temperature 98.2 F (36.8 C), temperature source Oral, resp. rate 18, SpO2 97 %.There is no height or weight on file to calculate BMI.   General Appearance: Casual  Eye Contact::  Fair  Speech:  Clear and Coherent  Volume:  Normal  Mood:  Irritable  Affect:  Congruent  Thought Process:  Coherent  Orientation:  Full (Time, Place, and Person)  Thought Content:  Hallucinations: None  Suicidal Thoughts:  No  Homicidal Thoughts:  No  Memory:  Recent;   Good Remote;   Good  Judgement:  Good  Insight:  Fair  Psychomotor Activity:  Normal  Concentration:  Fair  Recall:  Good  Fund of Knowledge:Fair  Language: Good  Akathisia:  No  Handed:  Right  AIMS (if indicated):     Assets:  Desire for Improvement Resilience Social Support  Sleep:     Cognition: WNL  ADL's:  Intact    Disposition: No evidence of imminent risk to self or others at present.   Patient does not meet criteria for psychiatric inpatient admission. Supportive therapy provided about ongoing stressors. Discussed crisis plan, support from social network, calling 911, coming to the Emergency Department, and calling Suicide Hotline.   - TTS to provided additional resource  ie [facility], [facility] and Michelene Heady, NP 11/25/2016 11:36 AM  Patient seen face-to-face for psychiatric evaluation, chart reviewed and case discussed with the physician extender and developed treatment plan. Reviewed the information documented and agree with the treatment plan. Corena Pilgrim, MD
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
## 2016-11-27 DIAGNOSIS — Z79899 Other long term (current) drug therapy: Secondary | ICD-10-CM | POA: Insufficient documentation

## 2016-11-27 DIAGNOSIS — F17210 Nicotine dependence, cigarettes, uncomplicated: Secondary | ICD-10-CM | POA: Insufficient documentation

## 2016-11-27 DIAGNOSIS — F1994 Other psychoactive substance use, unspecified with psychoactive substance-induced mood disorder: Secondary | ICD-10-CM | POA: Insufficient documentation

## 2016-11-28 ENCOUNTER — Emergency Department (HOSPITAL_COMMUNITY)
Admission: EM | Admit: 2016-11-28 | Discharge: 2016-11-28 | Disposition: A | Discharge status: Home / Self Care | Payer: Self-pay | Attending: Emergency Medicine | Admitting: Emergency Medicine

## 2016-11-28 ENCOUNTER — Encounter (HOSPITAL_COMMUNITY): Payer: Self-pay | Admitting: Emergency Medicine

## 2016-11-28 DIAGNOSIS — R45851 Suicidal ideations: Secondary | ICD-10-CM

## 2016-11-28 DIAGNOSIS — F1994 Other psychoactive substance use, unspecified with psychoactive substance-induced mood disorder: Secondary | ICD-10-CM | POA: Diagnosis present

## 2016-11-28 LAB — RAPID URINE DRUG SCREEN, HOSP PERFORMED
Amphetamines: NOT DETECTED
Barbiturates: NOT DETECTED
Benzodiazepines: NOT DETECTED
Cocaine: NOT DETECTED
OPIATES: NOT DETECTED
Tetrahydrocannabinol: NOT DETECTED

## 2016-11-28 LAB — COMPREHENSIVE METABOLIC PANEL
ALK PHOS: 63 U/L (ref 38–126)
ALT: 126 U/L — AB (ref 17–63)
AST: 64 U/L — ABNORMAL HIGH (ref 15–41)
Albumin: 4.2 g/dL (ref 3.5–5.0)
Anion gap: 9 (ref 5–15)
BILIRUBIN TOTAL: 0.5 mg/dL (ref 0.3–1.2)
BUN: 8 mg/dL (ref 6–20)
CALCIUM: 9.4 mg/dL (ref 8.9–10.3)
CO2: 21 mmol/L — ABNORMAL LOW (ref 22–32)
CREATININE: 0.98 mg/dL (ref 0.61–1.24)
Chloride: 107 mmol/L (ref 101–111)
Glucose, Bld: 95 mg/dL (ref 65–99)
Potassium: 3.6 mmol/L (ref 3.5–5.1)
Sodium: 137 mmol/L (ref 135–145)
TOTAL PROTEIN: 7.6 g/dL (ref 6.5–8.1)

## 2016-11-28 LAB — CBC
HCT: 44.5 % (ref 39.0–52.0)
Hemoglobin: 16 g/dL (ref 13.0–17.0)
MCH: 33.5 pg (ref 26.0–34.0)
MCHC: 36 g/dL (ref 30.0–36.0)
MCV: 93.3 fL (ref 78.0–100.0)
PLATELETS: 193 10*3/uL (ref 150–400)
RBC: 4.77 MIL/uL (ref 4.22–5.81)
RDW: 12.6 % (ref 11.5–15.5)
WBC: 5.4 10*3/uL (ref 4.0–10.5)

## 2016-11-28 LAB — ETHANOL

## 2016-11-28 LAB — ACETAMINOPHEN LEVEL: Acetaminophen (Tylenol), Serum: <10 ug/mL — ABNORMAL LOW (ref 10–30)

## 2016-11-28 LAB — SALICYLATE LEVEL

## 2016-11-28 MED ORDER — ALUM & MAG HYDROXIDE-SIMETH 200-200-20 MG/5ML PO SUSP: 30.0000 mL | Freq: Four times a day (QID) | ORAL | Status: DC | PRN

## 2016-11-28 MED ORDER — ONDANSETRON HCL 4 MG PO TABS: 4.0000 mg | ORAL_TABLET | Freq: Three times a day (TID) | ORAL | Status: DC | PRN

## 2016-11-28 MED ORDER — IBUPROFEN 200 MG PO TABS: 600.0000 mg | ORAL_TABLET | Freq: Three times a day (TID) | ORAL | Status: DC | PRN

## 2016-11-28 MED ORDER — ZOLPIDEM TARTRATE 5 MG PO TABS
5.0000 mg | ORAL_TABLET | Freq: Every evening | ORAL | Status: DC | PRN
  Administered 2016-11-28: 5 mg via ORAL
  Filled 2016-11-28: qty 1

## 2016-11-28 MED ORDER — GABAPENTIN 100 MG PO CAPS
100.0000 mg | ORAL_CAPSULE | Freq: Two times a day (BID) | ORAL | Status: DC
  Administered 2016-11-28: 100 mg via ORAL
  Filled 2016-11-28: qty 1

## 2016-11-28 MED ORDER — NICOTINE 21 MG/24HR TD PT24: 21.0000 mg | MEDICATED_PATCH | Freq: Every day | TRANSDERMAL | Status: DC

## 2016-11-28 MED ORDER — CLONIDINE HCL 0.1 MG PO TABS
0.1000 mg | ORAL_TABLET | Freq: Every day | ORAL | Status: DC
  Administered 2016-11-28: 0.1 mg via ORAL
  Filled 2016-11-28: qty 1

## 2016-11-28 MED ORDER — SERTRALINE HCL 50 MG PO TABS
200.0000 mg | ORAL_TABLET | Freq: Every day | ORAL | Status: DC
  Administered 2016-11-28: 200 mg via ORAL
  Filled 2016-11-28: qty 4

## 2016-11-28 NOTE — BH Assessment (Signed)
[facility] Assessment Progress Note  Per Thedore MinsMojeed Akintayo, MD, this pt does not require psychiatric hospitalization at this time.  [facility]  This has been included in pt's discharge instructions.  Pt's nurse, Diane, has been notified.  George Canninghomas Shubh Chiara, MA Triage Specialist [phone]

## 2016-11-28 NOTE — ED Notes (Signed)
Pt discharged home. Discharged instructions read to pt who verbalized understanding. All belongings returned to pt who signed for same. Pt was cooperative with the discharge. He was not delusional and not responding to internal stimuli.  Escorted pt to the ED exit.

## 2016-11-28 NOTE — Discharge Instructions (Signed)
To help you maintain a sober lifestyle, a substance abuse treatment program may be beneficial to you.  Contact [facility] at your earliest opportunity to ask about enrolling in their program: ° °     [facility] °     [address] °     [place], [place] [address] °     [phone] °     New patients are seen at the walk-in clinic every Tuesday from 9:00 am - 12:00 pm °

## 2016-11-28 NOTE — BHH Counselor (Signed)
Per Nira ConnJason Berry, NP: AM psych evaluation recommended.   Attending Provider: Mora Bellmanni, MD notified of disposition at [phone].

## 2016-11-28 NOTE — ED Notes (Signed)
Pt. In burgundy scrubs and belongings searched and wanded by security. Pt.has 1 green bookbag, 2 belongings bags. Pt. Has 1 pr. Gray shoes, 1 brown pant, 1 pr black socks, 1 brown belt and 1 gray t-shirt. No cell phone and no wallet and no money. Pt. Belongings locked up at nurses station in triage.

## 2016-11-28 NOTE — ED Provider Notes (Signed)
[facility]-EMERGENCY DEPT Provider Note   CSN: [id number] Arrival date & time: 11/27/16  2346     History   Chief Complaint Chief Complaint  Patient presents with  . Suicidal    HPI George Walls is a 31 y.o. male.  HPI   31 year old male with history of polysubstance abuse, IV drug use, hepatitis C, recurrent suicidal thoughts presenting for evaluation of having suicidal thoughts. Patient states he has been feeling very depressed, he has been relapsing on cocaine use about a week ago, he was seen in the ER on 8/27 due to having suicidal thoughts. States that he was hoping to be admitted for inpatient psychiatric care however he was discharged by the psychiatrist the next day. He has been stealing duster cans to huff on and was caught for stealing this morning, was given a citation.  Tonight he was planning on cutting his wrist with a broken shard of glass but decided to call to seek psychiatric help.  He denies HI, auditory or visual hallucination.  Endorse nausea without vomiting or diarrhea, no fever or chills.    Past Medical History:  Diagnosis Date  . Hepatitis C   . IVDU (intravenous drug user)   . Polysubstance dependence including opioid type drug with complication, episodic abuse [facility])     Patient Active Problem List   Diagnosis Date Noted  . Substance induced mood disorder (HCC) 11/25/2016  . Hepatitis C 10/23/2016  . Bronchitis, acute 10/23/2016  . Dental caries 10/23/2016  . Polysubstance dependence including opioid type drug with complication, episodic abuse (HCC) 10/21/2016  . Aspiration pneumonia (HCC) 10/21/2016  . Sepsis (HCC) 10/21/2016  . Transaminitis 10/18/2016  . Elevated troponin 10/18/2016  . Accidental drug overdose 10/17/2016  . Pulmonary edema, noncardiac 10/17/2016  . Acute on chronic respiratory failure with hypoxia (HCC) 10/17/2016    History reviewed. No pertinent surgical history.     Home Medications    Prior to Admission  medications   Medication Sig Start Date End Date Taking? Authorizing Provider  gabapentin (NEURONTIN) 100 MG capsule Take 100-300 mg by mouth 2 (two) times daily. Pt takes one in the morning and three at bedtime.   Yes [provider]  sertraline (ZOLOFT) 100 MG tablet Take 200 mg by mouth daily.   Yes [provider]  cloNIDine (CATAPRES) 0.1 MG tablet Take 1 tablet (0.1 mg total) by mouth daily. For withdrawal symptoms 11/23/16 11/28/16  Shaune Pollack, MD  dicyclomine (BENTYL) 20 MG tablet Take 1 tablet (20 mg total) by mouth 4 (four) times daily -  before meals and at bedtime. 11/23/16 11/28/16  Shaune Pollack, MD  loperamide (IMODIUM) 2 MG capsule Take 1 capsule (2 mg total) by mouth 4 (four) times daily as needed for diarrhea or loose stools. 11/23/16   Shaune Pollack, MD  naproxen (NAPROSYN) 375 MG tablet Take 1 tablet (375 mg total) by mouth 2 (two) times daily as needed for moderate pain. 11/23/16 11/30/16  Shaune Pollack, MD  ondansetron (ZOFRAN ODT) 4 MG disintegrating tablet Take 1 tablet (4 mg total) by mouth every 8 (eight) hours as needed for nausea or vomiting. 11/23/16   Shaune Pollack, MD    Family History Family History  Problem Relation Age of Onset  . CAD Neg Hx   . Bleeding Disorder Neg Hx     Social History Social History  Substance Use Topics  . Smoking status: Current Every Day Smoker    Packs/day: 2.00    Types: Cigarettes  .  Smokeless tobacco: Never Used  . Alcohol use No     Allergies   Sulfa antibiotics   Review of Systems Review of Systems  All other systems reviewed and are negative.    Physical Exam Updated Vital Signs BP 114/81 (BP Location: Left Arm)   Pulse (!) 108   Temp 99.3 F (37.4 C) (Oral)   Resp 18   SpO2 98%   Physical Exam  Constitutional: He is oriented to person, place, and time. He appears well-developed and well-nourished. No distress.  HENT:  Head: Atraumatic.  Eyes: Conjunctivae are normal.  Neck:  Neck supple.  Cardiovascular: Normal rate and regular rhythm.   Pulmonary/Chest: Effort normal and breath sounds normal.  Abdominal: Bowel sounds are normal. He exhibits no distension. There is no tenderness.  Neurological: He is alert and oriented to person, place, and time.  Skin: No rash noted.  Psychiatric: He has a normal mood and affect.  Nursing note and vitals reviewed.    ED Treatments / Results  Labs (all labs ordered are listed, but only abnormal results are displayed) Labs Reviewed  COMPREHENSIVE METABOLIC PANEL - Abnormal; Notable for the following:       Result Value   CO2 21 (*)    AST 64 (*)    ALT 126 (*)    All other components within normal limits  ACETAMINOPHEN LEVEL - Abnormal; Notable for the following:    Acetaminophen (Tylenol), Serum <10 (*)    All other components within normal limits  ETHANOL  SALICYLATE LEVEL  CBC  RAPID URINE DRUG SCREEN, HOSP PERFORMED    EKG  EKG Interpretation None       Radiology No results found.  Procedures Procedures (including critical care time)  Medications Ordered in ED Medications - No data to display   Initial Impression / Assessment and Plan / ED Course  I have reviewed the triage vital signs and the nursing notes.  Pertinent labs & imaging results that were available during my care of the patient were reviewed by me and considered in my medical decision making (see chart for details).     BP 114/81 (BP Location: Left Arm)   Pulse (!) 108   Temp 99.3 F (37.4 C) (Oral)   Resp 18   SpO2 98%    Final Clinical Impressions(s) / ED Diagnoses   Final diagnoses:  Suicidal ideation    New Prescriptions New Prescriptions   No medications on file   12:44 AM He patient has been seen in ED multiple times for suicidal ideation. Today he states he actually had a plan of cutting himself. He wants inpatient psychiatric assessment. He will benefit from TTS assessment.  2:06 AM Mild transaminitis  (AST 64, ALT 126) otherw3ise pt is medically cleared.  TTS have evaluated pt and pt meets inpt criteria for psychiatric admission.     Fayrene Helperran, Britteney Ayotte, PA-C 11/28/16 [phone]    Tomasita Crumbleni, Adeleke, MD 11/28/16 [phone]

## 2016-11-28 NOTE — ED Notes (Signed)
Patient reports SI with a plan to cut himself. Patient requesting food during assessment. Patient denies HI and AVH at this time. Plan of care discussed with patient. Encouragement and support provided and safety maintain. Q 15 min safety checks in place and video monitoring.
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
## 2016-11-28 NOTE — ED Triage Notes (Signed)
Pt states he is supposed to be on Zoloft and Gabepentin but has been out of them for about a week and a half

## 2016-11-28 NOTE — ED Triage Notes (Signed)
Pt brought in by EMS with c/o suicidal ideations  Pt was found earlier today behind [employer] huffing dusting arousals  Pt has old scratches noted to his left arm that he states he got while sleeping

## 2016-11-28 NOTE — [facility] Suicide Risk Assessment (Signed)
Suicide Risk Assessment  Discharge Assessment   [facility]   Principal Problem: Substance induced mood disorder [facility](HCC) Discharge Diagnoses:  Patient Active Problem List   Diagnosis Date Noted  . Substance induced mood disorder (HCC) [F19.94] 11/25/2016    Priority: High  . Hepatitis C [B19.20] 10/23/2016  . Bronchitis, acute [J20.9] 10/23/2016  . Dental caries [K02.9] 10/23/2016  . Polysubstance dependence including opioid type drug with complication, episodic abuse (HCC) [Z61.096][F11.229] 10/21/2016  . Aspiration pneumonia (HCC) [J69.0] 10/21/2016  . Sepsis (HCC) [A41.9] 10/21/2016  . Transaminitis [R74.0] 10/18/2016  . Elevated troponin [R74.8] 10/18/2016  . Accidental drug overdose [T50.901A] 10/17/2016  . Pulmonary edema, noncardiac [J81.1] 10/17/2016  . Acute on chronic respiratory failure with hypoxia (HCC) [J96.21] 10/17/2016    Total Time spent with patient: 45 minutes  Musculoskeletal: Strength & Muscle Tone: within normal limits Gait & Station: normal Patient leans: N/A  Psychiatric Specialty Exam:   Blood pressure 123/86, pulse 73, temperature 98.5 F (36.9 C), temperature source Oral, resp. rate 16, SpO2 99 %.There is no height or weight on file to calculate BMI.  General Appearance: Casual  Eye Contact::  Good  Speech:  Normal Rate409  Volume:  Normal  Mood:  Irritable  Affect:  Congruent  Thought Process:  Coherent and Descriptions of Associations: Intact  Orientation:  Full (Time, Place, and Person)  Thought Content:  WDL and Logical  Suicidal Thoughts:  No  Homicidal Thoughts:  No  Memory:  Immediate;   Good Recent;   Good Remote;   Good  Judgement:  Fair  Insight:  Fair  Psychomotor Activity:  Normal  Concentration:  Good  Recall:  Good  Fund of Knowledge:Fair  Language: Good  Akathisia:  No  Handed:  Right  AIMS (if indicated):     Assets:  Leisure Time Physical Health Resilience  Sleep:     Cognition: WNL  ADL's:   Intact   Mental Status Per Nursing Assessment::   On Admission:   huffing with suicidal ideations.  On assessment, he was clear and coherent with no suicidal ideations or homicidal ideations, hallucinations, or withdrawal symptoms.  Encouraged him to use his ADS resources provided.  Demographic Factors:  Male and Caucasian  Loss Factors:  Legal issues  Historical Factors: Family history of mental illness or substance abuse  Risk Reduction Factors:   Sense of responsibility to family, Employed, Living with another person, especially a relative and Positive social support  Continued Clinical Symptoms:  Irritability   Cognitive Features That Contribute To Risk:  None    Suicide Risk:  Minimal: No identifiable suicidal ideation.  Patients presenting with no risk factors but with morbid ruminations; may be classified as minimal risk based on the severity of the depressive symptoms    Plan Of Care/Follow-up recommendations:  Activity:  as tolerated Diet:  heart healthy diet  Samyra Limb, NP 11/28/2016, 11:47 AM

## 2017-01-19 ENCOUNTER — Emergency Department (HOSPITAL_COMMUNITY)
Admission: EM | Admit: 2017-01-19 | Discharge: 2017-01-20 | Disposition: A | Discharge status: Home / Self Care | Payer: Self-pay | Attending: Emergency Medicine | Admitting: Emergency Medicine

## 2017-01-19 ENCOUNTER — Encounter (HOSPITAL_COMMUNITY): Payer: Self-pay | Admitting: *Deleted

## 2017-01-19 DIAGNOSIS — Z79899 Other long term (current) drug therapy: Secondary | ICD-10-CM | POA: Insufficient documentation

## 2017-01-19 DIAGNOSIS — F1414 Cocaine abuse with cocaine-induced mood disorder: Secondary | ICD-10-CM | POA: Insufficient documentation

## 2017-01-19 DIAGNOSIS — F17210 Nicotine dependence, cigarettes, uncomplicated: Secondary | ICD-10-CM | POA: Insufficient documentation

## 2017-01-19 DIAGNOSIS — R45851 Suicidal ideations: Secondary | ICD-10-CM | POA: Insufficient documentation

## 2017-01-19 NOTE — ED Notes (Signed)
Bed: WA28 Expected date:  Expected time:  Means of arrival:  Comments: 

## 2017-01-19 NOTE — ED Triage Notes (Signed)
Pt having suicidal thoughts without plan x 2 days.  [facility]

## 2017-01-20 DIAGNOSIS — R4587 Impulsiveness: Secondary | ICD-10-CM

## 2017-01-20 DIAGNOSIS — F1414 Cocaine abuse with cocaine-induced mood disorder: Secondary | ICD-10-CM

## 2017-01-20 LAB — COMPREHENSIVE METABOLIC PANEL
ALK PHOS: 65 U/L (ref 38–126)
ALT: 135 U/L — AB (ref 17–63)
AST: 58 U/L — AB (ref 15–41)
Albumin: 4.1 g/dL (ref 3.5–5.0)
Anion gap: 9 (ref 5–15)
BILIRUBIN TOTAL: 0.7 mg/dL (ref 0.3–1.2)
BUN: 12 mg/dL (ref 6–20)
CALCIUM: 9.3 mg/dL (ref 8.9–10.3)
CHLORIDE: 107 mmol/L (ref 101–111)
CO2: 25 mmol/L (ref 22–32)
CREATININE: 0.97 mg/dL (ref 0.61–1.24)
GFR calc Af Amer: >60 mL/min (ref >60)
Glucose, Bld: 95 mg/dL (ref 65–99)
Potassium: 4.2 mmol/L (ref 3.5–5.1)
Sodium: 141 mmol/L (ref 135–145)
Total Protein: 7.1 g/dL (ref 6.5–8.1)

## 2017-01-20 LAB — CBC
HCT: 46.2 % (ref 39.0–52.0)
Hemoglobin: 16.3 g/dL (ref 13.0–17.0)
MCH: 33.1 pg (ref 26.0–34.0)
MCHC: 35.3 g/dL (ref 30.0–36.0)
MCV: 93.9 fL (ref 78.0–100.0)
PLATELETS: 195 10*3/uL (ref 150–400)
RBC: 4.92 MIL/uL (ref 4.22–5.81)
RDW: 12.5 % (ref 11.5–15.5)
WBC: 8.8 10*3/uL (ref 4.0–10.5)

## 2017-01-20 LAB — ACETAMINOPHEN LEVEL: Acetaminophen (Tylenol), Serum: <10 ug/mL — ABNORMAL LOW (ref 10–30)

## 2017-01-20 LAB — RAPID URINE DRUG SCREEN, HOSP PERFORMED
Amphetamines: NOT DETECTED
BARBITURATES: NOT DETECTED
Benzodiazepines: NOT DETECTED
COCAINE: POSITIVE — AB
Opiates: NOT DETECTED
TETRAHYDROCANNABINOL: NOT DETECTED

## 2017-01-20 LAB — ETHANOL

## 2017-01-20 LAB — SALICYLATE LEVEL: Salicylate Lvl: <7 mg/dL (ref 2.8–30.0)

## 2017-01-20 MED ORDER — ALUM & MAG HYDROXIDE-SIMETH 200-200-20 MG/5ML PO SUSP: 30.0000 mL | Freq: Four times a day (QID) | ORAL | Status: DC | PRN

## 2017-01-20 MED ORDER — ACETAMINOPHEN 325 MG PO TABS: 650.0000 mg | ORAL_TABLET | ORAL | Status: DC | PRN

## 2017-01-20 MED ORDER — GABAPENTIN 100 MG PO CAPS
100.0000 mg | ORAL_CAPSULE | Freq: Every day | ORAL | Status: DC
  Administered 2017-01-20: 100 mg via ORAL
  Filled 2017-01-20: qty 1

## 2017-01-20 MED ORDER — ONDANSETRON HCL 4 MG PO TABS: 4.0000 mg | ORAL_TABLET | Freq: Three times a day (TID) | ORAL | Status: DC | PRN

## 2017-01-20 MED ORDER — NICOTINE 21 MG/24HR TD PT24: 21.0000 mg | MEDICATED_PATCH | Freq: Once | TRANSDERMAL | Status: DC

## 2017-01-20 MED ORDER — ZIPRASIDONE MESYLATE 20 MG IM SOLR: 20.0000 mg | INTRAMUSCULAR | Status: DC | PRN

## 2017-01-20 MED ORDER — LORAZEPAM 1 MG PO TABS: 1.0000 mg | ORAL_TABLET | ORAL | Status: DC | PRN

## 2017-01-20 MED ORDER — GABAPENTIN 300 MG PO CAPS
300.0000 mg | ORAL_CAPSULE | Freq: Every day | ORAL | Status: DC
  Administered 2017-01-20: 300 mg via ORAL
  Filled 2017-01-20: qty 1

## 2017-01-20 MED ORDER — SERTRALINE HCL 50 MG PO TABS
200.0000 mg | ORAL_TABLET | Freq: Every day | ORAL | Status: DC
  Administered 2017-01-20: 200 mg via ORAL
  Filled 2017-01-20: qty 4

## 2017-01-20 MED ORDER — RISPERIDONE 1 MG PO TBDP: 2.0000 mg | ORAL_TABLET | Freq: Three times a day (TID) | ORAL | Status: DC | PRN

## 2017-01-20 MED ORDER — ZOLPIDEM TARTRATE 5 MG PO TABS: 5.0000 mg | ORAL_TABLET | Freq: Every evening | ORAL | Status: DC | PRN

## 2017-01-20 NOTE — ED Notes (Signed)
Called phlebotomy. She said she would come up to try to get the blood when she is able. Stated she is the only one working, so it might be a while.
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
## 2017-01-20 NOTE — ED Notes (Signed)
Pt presents with SI, plan to overdose on pills.  Pt admits to overdose on pills x 4 mos ago.  Denies HI or AVH.  Denies feeling hopeless.  [facility]  Pt admits to polysubstance abuse..  Denies alcohol use.  A&O x 3, no distress noted, calm & cooperative.  Monitoring for safety, Q 15 min checks in effect.  Safety check for contraband completed, no items found.

## 2017-01-20 NOTE — BHH Suicide Risk Assessment (Signed)
Suicide Risk Assessment  Discharge Assessment   [facility] Discharge Suicide Risk Assessment   Principal Problem: Cocaine abuse with cocaine-induced mood disorder [facility]) Discharge Diagnoses:  Patient Active Problem List   Diagnosis Date Noted  . Cocaine abuse with cocaine-induced mood disorder (HCC) [F14.14] 01/20/2017  . Substance induced mood disorder (HCC) [F19.94] 11/25/2016  . Hepatitis C [B19.20] 10/23/2016  . Bronchitis, acute [J20.9] 10/23/2016  . Dental caries [K02.9] 10/23/2016  . Polysubstance dependence including opioid type drug with complication, episodic abuse (HCC) [F19.20] 10/21/2016  . Aspiration pneumonia (HCC) [J69.0] 10/21/2016  . Sepsis (HCC) [A41.9] 10/21/2016  . Transaminitis [R74.0] 10/18/2016  . Elevated troponin [R74.8] 10/18/2016  . Accidental drug overdose [T50.901A] 10/17/2016  . Pulmonary edema, noncardiac [J81.1] 10/17/2016  . Acute on chronic respiratory failure with hypoxia [facility]) [J96.21] 10/17/2016   Pt was seen and chart reviewed with treatment team, Dr Jannifer Franklin and Dr Sharma Covert. Pt presented to the [facility] for an evaluation of depression. Pt stated he had been feeling suicidal for 2 days but does not have a plan. Pt stated he works in [employer] and lives with a friend. Pt stated he used cocaine once. Pt's UDS positive for Cocaine, BAL negative. Pt goes to [facility] for medication management and therapy and stated he is compliant with his medications; Zoloft and Gabapentin. Today,  Pt denies suicidal/homicidal ideation, denies auditory/visual hallucinations and does not appear to be responding to internal stimuli. Pt was offered Peer Support service which he refused. Pt is stable and psychiatrically clear for discharge.    Total Time spent with patient: 45 minutes  Musculoskeletal: Strength & Muscle Tone: within normal limits Gait & Station: normal Patient leans: N/A  Psychiatric Specialty Exam:   Blood pressure 120/69, pulse 75, temperature 97.8 F (36.6  C), temperature source Oral, resp. rate 18, height 5\' 11"  (1.803 m), weight 79.4 kg (175 lb), SpO2 96 %.Body mass index is 24.41 kg/m.  General Appearance: Casual  Eye Contact::  Good  Speech:  Clear and Coherent and Normal Rate409  Volume:  Normal  Mood:  Anxious and Depressed  Affect:  Congruent  Thought Process:  Coherent, Goal Directed and Linear  Orientation:  Full (Time, Place, and Person)  Thought Content:  Logical  Suicidal Thoughts:  No  Homicidal Thoughts:  No  Memory:  Immediate;   Good Recent;   Good Remote;   Fair  Judgement:  Fair  Insight:  Fair  Psychomotor Activity:  Normal  Concentration:  Good  Recall:  Good  Fund of Knowledge:Good  Language: Good  Akathisia:  No  Handed:  Right  AIMS (if indicated):     Assets:  Architect Housing Physical Health Resilience Social Support Vocational/Educational  Sleep:     Cognition: WNL  ADL's:  Intact   Mental Status Per Nursing Assessment::   On Admission:   deppresed with suicidal ideation  Demographic Factors:  Male, Caucasian and Low socioeconomic status  Loss Factors: Legal issues and Financial problems/change in socioeconomic status  Historical Factors: Impulsivity  Risk Reduction Factors:   Sense of responsibility to family, Employed and Living with another person, especially a relative  Continued Clinical Symptoms:  Depression:   Impulsivity Alcohol/Substance Abuse/Dependencies More than one psychiatric diagnosis Previous Psychiatric Diagnoses and Treatments Medical Diagnoses and Treatments/Surgeries  Cognitive Features That Contribute To Risk:  Closed-mindedness    Suicide Risk:  Minimal: No identifiable suicidal ideation.  Patients presenting with no risk factors but with morbid ruminations; may be classified as minimal risk  based on the severity of the depressive symptoms    Plan Of Care/Follow-up recommendations:  Activity:  as  tolerated Diet:  Heart Healthy  Laveda AbbeLaurie Britton Parks, NP 01/20/2017, 12:12 PM

## 2017-01-20 NOTE — ED Notes (Signed)
[id number] Expected date:  Expected time:  Means of arrival:  Comments: #28 George OverlieMasterson

## 2017-01-20 NOTE — Patient Outreach (Signed)
ED Peer Support Specialist Patient Intake (Complete at intake & 30-60 Day Follow-up)  Name: George Walls  MRN: [id number]  Age: 31 y.o.   Date of Admission: 01/20/2017  Intake: Initial Comments:      Primary Reason Admitted: depression, suicidal ideation, poly substance use  Lab values: Alcohol/ETOH: Negative Positive UDS? Yes Amphetamines: No Barbiturates: No Benzodiazepines: No Cocaine: Yes Opiates: No Cannabinoids: No  Demographic information: Gender: Male Ethnicity: White Marital Status: Divorced Insurance Status: Uninsured/Self-pay Control and instrumentation engineereceives non-medical governmental assistance (Work Engineer, agriculturalirst/Welfare, Sales executivefood stamps, etc.: No Lives with: Friend/Rommate Living situation: House/Apartment  Reported Patient History: Patient reported health conditions: Depression Patient aware of HIV and hepatitis status: Yes (comment) (Hepatitis C)  In past year, has patient visited ED for any reason? Yes  Number of ED visits: 2  Reason(s) for visit: pnemonia, suicidal ideations  In past year, has patient been hospitalized for any reason? Yes  Number of hospitalizations: 1  Reason(s) for hospitalization: suicidal ideations  In past year, has patient been arrested? No  Number of arrests:    Reason(s) for arrest:    In past year, has patient been incarcerated? No  Number of incarcerations:    Reason(s) for incarceration:    In past year, has patient received medication-assisted treatment? No  In past year, patient received the following treatments: Residential treatment (non-hospital) [facility]([place] for inpatient substance use treatment center)  In past year, has patient received any harm reduction services? No  Did this include any of the following?    In past year, has patient received care from a mental health provider for diagnosis other than SUD? Yes ([facility] for mental health treatment)  In past year, is this first time patient has overdosed? No  Number of past  overdoses: 1  In past year, is this first time patient has been hospitalized for an overdose? No  Number of hospitalizations for overdose(s): 1  Is patient currently receiving treatment for a mental health diagnosis? [facility])  Patient reports experiencing difficulty participating in SUD treatment:      Most important reason(s) for this difficulty?    Has patient received prior services for treatment? Yes  In past, patient has received services from following agencies:  [facility]([place] for substance use inpatient treatment)  Plan of Care:  Suggested follow up at these agencies/treatment centers:  (Patient [facility] and will contact [facility] if he needs anymore help with treatment services.)  Other information:    [name], [facility]  01/20/2017 12:49 PM

## 2017-01-20 NOTE — BH Assessment (Signed)
BHH Assessment Progress Note  Per Thedore MinsMojeed Akintayo, MD, this pt does not require psychiatric hospitalization at this time.  [facility]  He is also to be provided with referral information for [facility] for substance abuse treatment.  These have been included in pt's discharge instructions.  Pt's nurse, Lincoln MaxinOlivette, has been notified.  Doylene Canninghomas Karine Garn, MA Triage Specialist [phone]

## 2017-01-20 NOTE — ED Provider Notes (Signed)
[facility]-EMERGENCY DEPT Provider Note   CSN: [id number] Arrival date & time: 01/19/17  2339     History   Chief Complaint Chief Complaint  Patient presents with  . Suicidal    x 2 days    HPI George Walls is a 31 y.o. male.  Patient presents to the emergency department for evaluation of depression.  Patient reports that he has been feeling suicidal for the last 2 days.  He does not have a plan, but does report previous suicide attempt by drug overdose.  He has not identified any factors that have caused him to become suicidal.      Past Medical History:  Diagnosis Date  . Hepatitis C   . IVDU (intravenous drug user)   . Polysubstance dependence including opioid type drug with complication, episodic abuse [facility](HCC)     Patient Active Problem List   Diagnosis Date Noted  . Substance induced mood disorder (HCC) 11/25/2016  . Hepatitis C 10/23/2016  . Bronchitis, acute 10/23/2016  . Dental caries 10/23/2016  . Polysubstance dependence including opioid type drug with complication, episodic abuse (HCC) 10/21/2016  . Aspiration pneumonia (HCC) 10/21/2016  . Sepsis (HCC) 10/21/2016  . Transaminitis 10/18/2016  . Elevated troponin 10/18/2016  . Accidental drug overdose 10/17/2016  . Pulmonary edema, noncardiac 10/17/2016  . Acute on chronic respiratory failure with hypoxia (HCC) 10/17/2016    History reviewed. No pertinent surgical history.     Home Medications    Prior to Admission medications   Medication Sig Start Date End Date Taking? Authorizing Provider  cloNIDine (CATAPRES) 0.1 MG tablet Take 1 tablet (0.1 mg total) by mouth daily. For withdrawal symptoms 11/23/16 11/28/16  Shaune PollackIsaacs, Cameron, MD  dicyclomine (BENTYL) 20 MG tablet Take 1 tablet (20 mg total) by mouth 4 (four) times daily -  before meals and at bedtime. 11/23/16 11/28/16  Shaune PollackIsaacs, Cameron, MD  gabapentin (NEURONTIN) 100 MG capsule Take 100-300 mg by mouth 2 (two) times  daily. Pt takes one in the morning and three at bedtime.    [provider]  loperamide (IMODIUM) 2 MG capsule Take 1 capsule (2 mg total) by mouth 4 (four) times daily as needed for diarrhea or loose stools. 11/23/16   Shaune PollackIsaacs, Cameron, MD  ondansetron (ZOFRAN ODT) 4 MG disintegrating tablet Take 1 tablet (4 mg total) by mouth every 8 (eight) hours as needed for nausea or vomiting. 11/23/16   Shaune PollackIsaacs, Cameron, MD  sertraline (ZOLOFT) 100 MG tablet Take 200 mg by mouth daily.    [provider]    Family History Family History  Problem Relation Age of Onset  . CAD Neg Hx   . Bleeding Disorder Neg Hx     Social History Social History  Substance Use Topics  . Smoking status: Current Every Day Smoker    Packs/day: 2.00    Types: Cigarettes  . Smokeless tobacco: Never Used  . Alcohol use No     Allergies   Sulfa antibiotics   Review of Systems Review of Systems  Psychiatric/Behavioral: Positive for dysphoric mood and suicidal ideas.  All other systems reviewed and are negative.    Physical Exam Updated Vital Signs BP 138/79 (BP Location: Right Arm)   Pulse (!) 101   Temp 98.2 F (36.8 C) (Oral)   Resp 17   Ht 5\' 11"  (1.803 m)   Wt 79.4 kg (175 lb)   SpO2 99%   BMI 24.41 kg/m   Physical Exam  Constitutional: He is  oriented to person, place, and time. He appears well-developed and well-nourished. No distress.  HENT:  Head: Normocephalic and atraumatic.  Right Ear: Hearing normal.  Left Ear: Hearing normal.  Nose: Nose normal.  Mouth/Throat: Oropharynx is clear and moist and mucous membranes are normal.  Eyes: Pupils are equal, round, and reactive to light. Conjunctivae and EOM are normal.  Neck: Normal range of motion. Neck supple.  Cardiovascular: Regular rhythm, S1 normal and S2 normal.  Exam reveals no gallop and no friction rub.   No murmur heard. Pulmonary/Chest: Effort normal and breath sounds normal. No respiratory distress. He exhibits no  tenderness.  Abdominal: Soft. Normal appearance and bowel sounds are normal. There is no hepatosplenomegaly. There is no tenderness. There is no rebound, no guarding, no tenderness at McBurney's point and negative Murphy's sign. No hernia.  Musculoskeletal: Normal range of motion.  Neurological: He is alert and oriented to person, place, and time. He has normal strength. No cranial nerve deficit or sensory deficit. Coordination normal. GCS eye subscore is 4. GCS verbal subscore is 5. GCS motor subscore is 6.  Skin: Skin is warm, dry and intact. No rash noted. No cyanosis.  Psychiatric: His speech is normal. He is withdrawn. He exhibits a depressed mood. He expresses suicidal ideation.  Nursing note and vitals reviewed.    ED Treatments / Results  Labs (all labs ordered are listed, but only abnormal results are displayed) Labs Reviewed  COMPREHENSIVE METABOLIC PANEL  ETHANOL  SALICYLATE LEVEL  ACETAMINOPHEN LEVEL  CBC  RAPID URINE DRUG SCREEN, HOSP PERFORMED    EKG  EKG Interpretation None       Radiology No results found.  Procedures Procedures (including critical care time)  Medications Ordered in ED Medications - No data to display   Initial Impression / Assessment and Plan / ED Course  I have reviewed the triage vital signs and the nursing notes.  Pertinent labs & imaging results that were available during my care of the patient were reviewed by me and considered in my medical decision making (see chart for details).     Patient presents with increased depression and suicidal ideation.  He will require psychiatric evaluation.  Final Clinical Impressions(s) / ED Diagnoses   Final diagnoses:  Suicidal ideations    New Prescriptions New Prescriptions   No medications on file     Gilda Crease, MD 01/20/17 0004

## 2017-01-20 NOTE — Discharge Instructions (Signed)
For your mental health needs, you are advised to continue treatment with [facility].  If you do not currently have an appointment, new and returning patients are seen at their walk-in clinic.  Walk-in hours are Monday - Friday from 8:00 am - 3:00 pm.  Walk-in patients are seen on a first come, first served basis.  Try to arrive as early as possible for he best chance of being seen the same day:       [facility]      [address]      [place], [place] [address]      [phone]  To help you maintain a sober lifestyle, a substance abuse treatment program may be beneficial to you.  Contact [facility] at your earliest opportunity to ask about enrolling in their program:       [facility]      [address]      [place], [place] [address]      [phone]      New patients are seen at the walk-in clinic every Tuesday from 9:00 am - 12:00 pm

## 2017-01-20 NOTE — ED Notes (Signed)
TTS assessment in progress via tele psych. 

## 2017-05-24 ENCOUNTER — Emergency Department (HOSPITAL_COMMUNITY): Payer: Self-pay

## 2017-05-24 ENCOUNTER — Emergency Department (HOSPITAL_COMMUNITY)
Admission: EM | Admit: 2017-05-24 | Discharge: 2017-05-24 | Disposition: A | Discharge status: Home / Self Care | Payer: Self-pay | Attending: Emergency Medicine | Admitting: Emergency Medicine

## 2017-05-24 ENCOUNTER — Encounter (HOSPITAL_COMMUNITY): Payer: Self-pay | Admitting: *Deleted

## 2017-05-24 ENCOUNTER — Other Ambulatory Visit: Payer: Self-pay

## 2017-05-24 DIAGNOSIS — M542 Cervicalgia: Secondary | ICD-10-CM | POA: Insufficient documentation

## 2017-05-24 DIAGNOSIS — F1110 Opioid abuse, uncomplicated: Secondary | ICD-10-CM | POA: Insufficient documentation

## 2017-05-24 DIAGNOSIS — F17210 Nicotine dependence, cigarettes, uncomplicated: Secondary | ICD-10-CM | POA: Insufficient documentation

## 2017-05-24 DIAGNOSIS — T50901A Poisoning by unspecified drugs, medicaments and biological substances, accidental (unintentional), initial encounter: Secondary | ICD-10-CM | POA: Insufficient documentation

## 2017-05-24 DIAGNOSIS — R51 Headache: Secondary | ICD-10-CM | POA: Insufficient documentation

## 2017-05-24 DIAGNOSIS — Z79899 Other long term (current) drug therapy: Secondary | ICD-10-CM | POA: Insufficient documentation

## 2017-05-24 LAB — I-STAT CHEM 8, ED
BUN: 25 mg/dL — AB (ref 6–20)
CALCIUM ION: 1.09 mmol/L — AB (ref 1.15–1.40)
CREATININE: 1 mg/dL (ref 0.61–1.24)
Chloride: 97 mmol/L — ABNORMAL LOW (ref 101–111)
GLUCOSE: 165 mg/dL — AB (ref 65–99)
HCT: 57 % — ABNORMAL HIGH (ref 39.0–52.0)
Hemoglobin: 19.4 g/dL — ABNORMAL HIGH (ref 13.0–17.0)
Potassium: 4.1 mmol/L (ref 3.5–5.1)
Sodium: 135 mmol/L (ref 135–145)
TCO2: 28 mmol/L (ref 22–32)

## 2017-05-24 LAB — I-STAT TROPONIN, ED: Troponin i, poc: 0.02 ng/mL (ref 0.00–0.08)

## 2017-05-24 MED ORDER — SODIUM CHLORIDE 0.9 % IV BOLUS (SEPSIS)
1000.0000 mL | Freq: Once | INTRAVENOUS | Status: AC
  Administered 2017-05-24: 1000 mL via INTRAVENOUS

## 2017-05-24 MED ORDER — SERTRALINE HCL 50 MG PO TABS: 150.0000 mg | ORAL_TABLET | Freq: Every day | ORAL | 0 refills | Status: DC

## 2017-05-24 MED ORDER — NALOXONE HCL 0.4 MG/ML IJ SOLN: 0.4000 mg | Freq: Once | INTRAMUSCULAR | Status: DC

## 2017-05-24 MED ORDER — NALOXONE HCL 4 MG/0.1ML NA LIQD
1.0000 | Freq: Once | NASAL | Status: AC
  Administered 2017-05-24: 1 via NASAL
  Filled 2017-05-24: qty 4

## 2017-05-24 MED ORDER — SODIUM CHLORIDE 0.9 % IV BOLUS (SEPSIS): 1000.0000 mL | Freq: Once | INTRAVENOUS | Status: DC

## 2017-05-24 MED ORDER — GABAPENTIN 100 MG PO CAPS
ORAL_CAPSULE | ORAL | 0 refills | Status: DC
Start: 2017-05-24 — End: 2017-09-17

## 2017-05-24 NOTE — ED Notes (Signed)
Bed: WA17 Expected date:  Expected time:  Means of arrival:  Comments: Hold for Resus A 

## 2017-05-24 NOTE — Discharge Instructions (Addendum)
[facility] and your primary care physician.  We recommend that you do not use heroin.  If you are any using heroin please do not take it by yourself.

## 2017-05-24 NOTE — ED Provider Notes (Signed)
[facility]-EMERG[facility] DEPT Provider Note   CSN: [id number] Arrival date & time: 05/24/17  [id number]     History   Chief Complaint Chief Complaint  Patient presents with  . Drug Overdose    HPI George Walls is a 32 y.o. male.  HPI   32 year old male found pulseless at home after doing heroin.  Patient reports that he was told the heroin had fentanyl in it, which he does not usually use.  Patient lives with an 32 year old male named George Nipugene who found him unresponsive on the floor.  Patient was given 2 mg of Narcan and he became responsive.  At present patient complains of neck and head pain.  Past Medical History:  Diagnosis Date  . Hepatitis C   . IVDU (intravenous drug user)   . Polysubstance dependence including opioid type drug with complication, episodic abuse [facility](HCC)     Patient Active Problem List   Diagnosis Date Noted  . Cocaine abuse with cocaine-induced mood disorder (HCC) 01/20/2017  . Substance induced mood disorder (HCC) 11/25/2016  . Hepatitis C 10/23/2016  . Bronchitis, acute 10/23/2016  . Dental caries 10/23/2016  . Polysubstance dependence including opioid type drug with complication, episodic abuse (HCC) 10/21/2016  . Aspiration pneumonia (HCC) 10/21/2016  . Sepsis (HCC) 10/21/2016  . Transaminitis 10/18/2016  . Elevated troponin 10/18/2016  . Accidental drug overdose 10/17/2016  . Pulmonary edema, noncardiac 10/17/2016  . Acute on chronic respiratory failure with hypoxia (HCC) 10/17/2016    History reviewed. No pertinent surgical history.     Home Medications    Prior to Admission medications   Medication Sig Start Date End Date Taking? Authorizing Provider  cloNIDine (CATAPRES) 0.1 MG tablet Take 1 tablet (0.1 mg total) by mouth daily. For withdrawal symptoms Patient not taking: Reported on 01/20/2017 11/23/16 11/28/16  Shaune PollackIsaacs, Cameron, MD  dicyclomine (BENTYL) 20 MG tablet Take 1 tablet (20 mg total) by mouth 4 (four)  times daily -  before meals and at bedtime. Patient not taking: Reported on 01/20/2017 11/23/16 11/28/16  Shaune PollackIsaacs, Cameron, MD  gabapentin (NEURONTIN) 100 MG capsule Take 100-400 mg by mouth 2 (two) times daily. Pt takes one in the morning and four at bedtime.    [provider]  loperamide (IMODIUM) 2 MG capsule Take 1 capsule (2 mg total) by mouth 4 (four) times daily as needed for diarrhea or loose stools. Patient not taking: Reported on 01/20/2017 11/23/16   Shaune PollackIsaacs, Cameron, MD  ondansetron (ZOFRAN ODT) 4 MG disintegrating tablet Take 1 tablet (4 mg total) by mouth every 8 (eight) hours as needed for nausea or vomiting. Patient not taking: Reported on 01/20/2017 11/23/16   Shaune PollackIsaacs, Cameron, MD  sertraline (ZOLOFT) 100 MG tablet Take 150 mg by mouth daily.     [provider]    Family History Family History  Problem Relation Age of Onset  . CAD Neg Hx   . Bleeding Disorder Neg Hx     Social History Social History   Tobacco Use  . Smoking status: Current Every Day Smoker    Packs/day: 2.00    Types: Cigarettes  . Smokeless tobacco: Never Used  Substance Use Topics  . Alcohol use: No  . Drug use: Yes    Types: Cocaine, Marijuana    Comment: Fentyanl      Allergies   Sulfa antibiotics   Review of Systems Review of Systems  Constitutional: Negative for activity change.  Respiratory: Negative for shortness of breath.   Cardiovascular: Negative  for chest pain.  Gastrointestinal: Negative for abdominal pain.     Physical Exam Updated Vital Signs There were no vitals taken for this visit.  Physical Exam  Constitutional: He is oriented to person, place, and time. He appears well-developed and well-nourished.  HENT:  Head: Normocephalic and atraumatic.  Towel around neck  Eyes: Conjunctivae are normal. Right eye exhibits no discharge. Left eye exhibits no discharge.  Neck: Neck supple.  Cardiovascular: Normal rate and regular rhythm.  No murmur  heard. Pulmonary/Chest: Effort normal and breath sounds normal. No respiratory distress.  Abdominal: Soft. There is no tenderness.  Musculoskeletal: He exhibits no edema.  Neurological: He is alert and oriented to person, place, and time. No cranial nerve deficit.  Skin: Skin is warm and dry.  Psychiatric: He has a normal mood and affect.  Nursing note and vitals reviewed.    ED Treatments / Results  Labs (all labs ordered are listed, but only abnormal results are displayed) Labs Reviewed - No data to display  EKG  EKG Interpretation None       Radiology No results found.  Procedures Procedures (including critical care time)  Medications Ordered in ED Medications  sodium chloride 0.9 % bolus 1,000 mL (not administered)     Initial Impression / Assessment and Plan / ED Course  I have reviewed the triage vital signs and the nursing notes.  Pertinent labs & imaging results that were available during my care of the patient were reviewed by me and considered in my medical decision making (see chart for details).      32 year old male found pulseless at home after doing heroin.  Patient reports that he was told the heroin had fentanyl in it, which he does not usually use.  Patient lives with an 32 year old male named George Walls who found him unresponsive on the floor.  Patient was given 2 mg of Narcan and he became responsive.  At present patient complains of neck and head pain.  10:14 AM Will get CT head and neck given trauma.  We will get baseline labs.  Will need to observe patient for at least 4 hours given half life of fentanyl/heroin and Narcan.    Final Clinical Impressions(s) / ED Diagnoses   Final diagnoses:  None    ED Discharge Orders    None       Abelino Derrick, MD 05/25/17 [phone]

## 2017-05-24 NOTE — ED Triage Notes (Addendum)
EMS reports pt was in bathroom floor face down unresponsive due to overdose of Fentanyl. Fire gave 2 mg Narcan 2 m IN EJ 18 left side with 2mg  Narcan repeated. Responsive, CBG 218. Used Fentanyl at 0830. 131/89-20-120

## 2017-05-24 NOTE — ED Provider Notes (Signed)
4:10 PM Care assumed from Dr. Corlis LeakMackuen.   Patient is awaiting reassessment after metabolism of his overdose.  Anticipate reassessment shortly and discharge.  PT felt much better and was able to ambulate without difficulty. PT appears clinically sober. PT discharged in good condition.       Clinical Impression: 1. Accidental drug overdose, initial encounter     Disposition: Discharge  Condition: Good  I have discussed the results, Dx and Tx plan with the pt(& family if present). He/she/they expressed understanding and agree(s) with the plan. Discharge instructions discussed at great length. Strict return precautions discussed and pt &/or family have verbalized understanding of the instructions. No further questions at time of discharge.    Discharge Medication List as of 05/24/2017  4:20 PM    START taking these medications   Details  !! gabapentin (NEURONTIN) 100 MG capsule Take 1 in the morning, 3 at night., Print    !! sertraline (ZOLOFT) 50 MG tablet Take 3 tablets (150 mg total) by mouth daily., Starting Sun 05/24/2017, Print     !! - Potential duplicate medications found. Please discuss with provider.      Follow Up: Patient, No Pcp Per        Tegeler, Canary Brimhristopher J, MD 05/25/17 [phone]

## 2017-09-17 ENCOUNTER — Emergency Department (HOSPITAL_COMMUNITY)
Admission: EM | Admit: 2017-09-17 | Discharge: 2017-09-17 | Disposition: A | Discharge status: Home / Self Care | Payer: Self-pay | Attending: Emergency Medicine | Admitting: Emergency Medicine

## 2017-09-17 ENCOUNTER — Encounter (HOSPITAL_COMMUNITY): Payer: Self-pay

## 2017-09-17 DIAGNOSIS — F17210 Nicotine dependence, cigarettes, uncomplicated: Secondary | ICD-10-CM | POA: Insufficient documentation

## 2017-09-17 DIAGNOSIS — Z79899 Other long term (current) drug therapy: Secondary | ICD-10-CM | POA: Insufficient documentation

## 2017-09-17 DIAGNOSIS — Z760 Encounter for issue of repeat prescription: Secondary | ICD-10-CM

## 2017-09-17 MED ORDER — GABAPENTIN 300 MG PO CAPS: ORAL_CAPSULE | ORAL | 1 refills | Status: AC

## 2017-09-17 MED ORDER — SERTRALINE HCL 100 MG PO TABS: 150.0000 mg | ORAL_TABLET | Freq: Every day | ORAL | 1 refills | Status: AC

## 2017-09-17 NOTE — ED Triage Notes (Signed)
Pt states that he needs refills on his neurotin and zoloft, he's been trying to get [facility] to do it and they will not call him back Pt states he's going out of town tomorrow for work and doesn't want to run out of his meds

## 2017-09-17 NOTE — Discharge Instructions (Signed)
Your medicines have been refilled for 2 months See your doctor for ongoing refills

## 2017-09-17 NOTE — ED Provider Notes (Signed)
[facility]-EM[facility]PT Provider Note   CSN: [id number] Arrival date & time: 09/17/17  2112     History   Chief Complaint Chief Complaint  Patient presents with  . Medication Refill    HPI Drinda ButtsChristopher Gumbs is a 32 y.o. male.  HPI  Presents requesting refill of Zoloft and his Neurontin which is almost out as well.  He has no other physical complaints. He states that he works and has been unable to get away from his job to get to [facility] get [facility] medications refilled  Past Medical History:  Diagnosis Date  . Hepatitis C   . IVDU (intravenous drug user)   . Polysubstance dependence including opioid type drug with complication, episodic abuse [facility](HCC)     Patient Active Problem List   Diagnosis Date Noted  . Cocaine abuse with cocaine-induced mood disorder (HCC) 01/20/2017  . Substance induced mood disorder (HCC) 11/25/2016  . Hepatitis C 10/23/2016  . Bronchitis, acute 10/23/2016  . Dental caries 10/23/2016  . Polysubstance dependence including opioid type drug with complication, episodic abuse (HCC) 10/21/2016  . Aspiration pneumonia (HCC) 10/21/2016  . Sepsis (HCC) 10/21/2016  . Transaminitis 10/18/2016  . Elevated troponin 10/18/2016  . Accidental drug overdose 10/17/2016  . Pulmonary edema, noncardiac 10/17/2016  . Acute on chronic respiratory failure with hypoxia (HCC) 10/17/2016    History reviewed. No pertinent surgical history.      Home Medications    Prior to Admission medications   Medication Sig Start Date End Date Taking? Authorizing Provider  cloNIDine (CATAPRES) 0.1 MG tablet Take 1 tablet (0.1 mg total) by mouth daily. For withdrawal symptoms Patient not taking: Reported on 01/20/2017 11/23/16 11/28/16  Shaune PollackIsaacs, Cameron, MD  dicyclomine (BENTYL) 20 MG tablet Take 1 tablet (20 mg total) by mouth 4 (four) times daily -  before meals and at bedtime. Patient not taking: Reported on 01/20/2017 11/23/16 11/28/16  Shaune PollackIsaacs,  Cameron, MD  gabapentin (NEURONTIN) 300 MG capsule 600mg  by mouth q AM and 900mg  by mouth q PM 09/17/17   Eber HongMiller, Kierra Jezewski, MD  loperamide (IMODIUM) 2 MG capsule Take 1 capsule (2 mg total) by mouth 4 (four) times daily as needed for diarrhea or loose stools. Patient not taking: Reported on 01/20/2017 11/23/16   Shaune PollackIsaacs, Cameron, MD  ondansetron (ZOFRAN ODT) 4 MG disintegrating tablet Take 1 tablet (4 mg total) by mouth every 8 (eight) hours as needed for nausea or vomiting. Patient not taking: Reported on 01/20/2017 11/23/16   Shaune PollackIsaacs, Cameron, MD  sertraline (ZOLOFT) 100 MG tablet Take 1.5 tablets (150 mg total) by mouth daily. 09/17/17   Eber HongMiller, Jaeson Molstad, MD  sertraline (ZOLOFT) 50 MG tablet Take 3 tablets (150 mg total) by mouth daily. 05/24/17   Mackuen, Cindee Saltourteney Lyn, MD    Family History Family History  Problem Relation Age of Onset  . CAD Neg Hx   . Bleeding Disorder Neg Hx     Social History Social History   Tobacco Use  . Smoking status: Current Every Day Smoker    Packs/day: 2.00    Types: Cigarettes  . Smokeless tobacco: Never Used  Substance Use Topics  . Alcohol use: No  . Drug use: Yes    Types: Cocaine, Marijuana    Comment: Fentyanl      Allergies   Sulfa antibiotics   Review of Systems Review of Systems  Constitutional: Negative for fever.  Gastrointestinal: Negative for vomiting.  Neurological: Negative for weakness, numbness and headaches.  Psychiatric/Behavioral:  Anxiety    Physical Exam Updated Vital Signs BP (!) 156/87 (BP Location: Left Arm)   Pulse (!) 101   Temp 98 F (36.7 C) (Oral)   Resp 18   SpO2 98%   Physical Exam  Constitutional: He appears well-developed and well-nourished.  HENT:  Head: Normocephalic and atraumatic.  Eyes: Conjunctivae are normal. Right eye exhibits no discharge. Left eye exhibits no discharge.  Pulmonary/Chest: Effort normal. No respiratory distress.  Neurological: He is alert. Coordination normal.  Skin: Skin  is warm and dry. No rash noted. He is not diaphoretic. No erythema.  Psychiatric: He has a normal mood and affect.  Nursing note and vitals reviewed.    ED Treatments / Results  Labs (all labs ordered are listed, but only abnormal results are displayed) Labs Reviewed - No data to display  EKG None  Radiology No results found.  Procedures Procedures (including critical care time)  Medications Ordered in ED Medications - No data to display   Initial Impression / Assessment and Plan / ED Course  I have reviewed the triage vital signs and the nursing notes.  Pertinent labs & imaging results that were available during my care of the patient were reviewed by me and considered in my medical decision making (see chart for details).    The pt has no acute fidnings - is well appearing Refill given - pt encouraged to follow up with [facility] for refills.  Final Clinical Impressions(s) / ED Diagnoses   Final diagnoses:  Medication refill    ED Discharge Orders        Ordered    sertraline (ZOLOFT) 100 MG tablet  Daily     09/17/17 2209    gabapentin (NEURONTIN) 300 MG capsule     09/17/17 2209       Eber Hong, MD 09/17/17 2209

## 2017-10-08 ENCOUNTER — Encounter (HOSPITAL_COMMUNITY): Payer: Self-pay | Admitting: Emergency Medicine

## 2017-10-08 ENCOUNTER — Other Ambulatory Visit: Payer: Self-pay

## 2017-10-08 ENCOUNTER — Emergency Department (HOSPITAL_COMMUNITY)
Admission: EM | Admit: 2017-10-08 | Discharge: 2017-10-08 | Disposition: A | Discharge status: Home / Self Care | Payer: Self-pay | Attending: Emergency Medicine | Admitting: Emergency Medicine

## 2017-10-08 DIAGNOSIS — T50901A Poisoning by unspecified drugs, medicaments and biological substances, accidental (unintentional), initial encounter: Secondary | ICD-10-CM | POA: Insufficient documentation

## 2017-10-08 DIAGNOSIS — F17210 Nicotine dependence, cigarettes, uncomplicated: Secondary | ICD-10-CM | POA: Insufficient documentation

## 2017-10-08 DIAGNOSIS — Z79899 Other long term (current) drug therapy: Secondary | ICD-10-CM | POA: Insufficient documentation

## 2017-10-08 LAB — COMPREHENSIVE METABOLIC PANEL
ALK PHOS: 60 U/L (ref 38–126)
ALT: 37 U/L (ref 0–44)
AST: 30 U/L (ref 15–41)
Albumin: 3.6 g/dL (ref 3.5–5.0)
Anion gap: 8 (ref 5–15)
BUN: 15 mg/dL (ref 6–20)
CALCIUM: 9 mg/dL (ref 8.9–10.3)
CO2: 28 mmol/L (ref 22–32)
CREATININE: 0.83 mg/dL (ref 0.61–1.24)
Chloride: 101 mmol/L (ref 98–111)
GFR calc Af Amer: >60 mL/min (ref >60)
Glucose, Bld: 96 mg/dL (ref 70–99)
Potassium: 3.8 mmol/L (ref 3.5–5.1)
Sodium: 137 mmol/L (ref 135–145)
Total Bilirubin: 0.6 mg/dL (ref 0.3–1.2)
Total Protein: 6.3 g/dL — ABNORMAL LOW (ref 6.5–8.1)

## 2017-10-08 LAB — CBC WITH DIFFERENTIAL/PLATELET
Basophils Absolute: 0 10*3/uL (ref 0.0–0.1)
Basophils Relative: 0 %
Eosinophils Absolute: 0.3 10*3/uL (ref 0.0–0.7)
Eosinophils Relative: 2 %
HEMATOCRIT: 41.5 % (ref 39.0–52.0)
HEMOGLOBIN: 14.5 g/dL (ref 13.0–17.0)
LYMPHS ABS: 2 10*3/uL (ref 0.7–4.0)
LYMPHS PCT: 14 %
MCH: 33.7 pg (ref 26.0–34.0)
MCHC: 34.9 g/dL (ref 30.0–36.0)
MCV: 96.5 fL (ref 78.0–100.0)
Monocytes Absolute: 0.9 10*3/uL (ref 0.1–1.0)
Monocytes Relative: 6 %
NEUTROS PCT: 78 %
Neutro Abs: 11.3 10*3/uL — ABNORMAL HIGH (ref 1.7–7.7)
Platelets: 276 10*3/uL (ref 150–400)
RBC: 4.3 MIL/uL (ref 4.22–5.81)
RDW: 13.2 % (ref 11.5–15.5)
WBC: 14.6 10*3/uL — AB (ref 4.0–10.5)

## 2017-10-08 LAB — ETHANOL

## 2017-10-08 NOTE — ED Notes (Signed)
Bed: XB28WA25 Expected date:  Expected time:  Means of arrival:  Comments: Ems heroin overdose

## 2017-10-08 NOTE — ED Notes (Signed)
Attempted x 2 to draw blood. Writer was only able to collect an ethanol level. Patient refused another attempt by Clinical research associatewriter and by Molly Maduroobert, EMT. Dr.Zammit notified by Molly Maduroobert, EMT.[employer] remains out in the hall.

## 2017-10-08 NOTE — ED Notes (Signed)
Patient wakens when name called.

## 2017-10-08 NOTE — ED Notes (Signed)
Patient wakes easily when name called.

## 2017-10-08 NOTE — ED Notes (Signed)
Patient pocketknife was surrendered to security by police.

## 2017-10-08 NOTE — Discharge Instructions (Signed)
Stop using all sorts of drugs.  Follow-up with [facility] for your hepatitis C

## 2017-10-08 NOTE — ED Notes (Signed)
Patient wakes quickly when name called.

## 2017-10-08 NOTE — ED Notes (Signed)
Patient wakes when name called.

## 2017-10-08 NOTE — ED Triage Notes (Signed)
Patient was found in the bathroom of a [place] station. A concerned party called the police. Patient was found semi-responsive with agonal breathing. 2 mg of narcan was administered and patient responded to sternal rub. Patient reports fentanyl use both yesterday and today. Now he complains of a 7/10 headache. Patient had a pocketknife which was surrendered to hospital security by police with a patient ID sticker.

## 2017-10-08 NOTE — ED Provider Notes (Signed)
[facility]-EMERGENCY DEPT Provider Note   CSN: [id number] Arrival date & time: 10/08/17  0865     History   Chief Complaint Chief Complaint  Patient presents with  . Drug Overdose    HPI George Walls is a 32 y.o. male.  Patient was using fentanyl in the store.  Passed out in the bathroom.  EMS was called and the police saw him and gave him some Narcan.  Patient states that he was not trying to hurt himself  The history is provided by the patient. No language interpreter was used.  Drug Overdose  This is a new problem. The current episode started 1 to 2 hours ago. The problem occurs constantly. The problem has not changed since onset.Pertinent negatives include no chest pain, no abdominal pain and no headaches. Nothing aggravates the symptoms. Nothing relieves the symptoms. He has tried nothing for the symptoms. The treatment provided no relief.    Past Medical History:  Diagnosis Date  . Hepatitis C   . IVDU (intravenous drug user)   . Polysubstance dependence including opioid type drug with complication, episodic abuse [facility])     Patient Active Problem List   Diagnosis Date Noted  . Cocaine abuse with cocaine-induced mood disorder (HCC) 01/20/2017  . Substance induced mood disorder (HCC) 11/25/2016  . Hepatitis C 10/23/2016  . Bronchitis, acute 10/23/2016  . Dental caries 10/23/2016  . Polysubstance dependence including opioid type drug with complication, episodic abuse (HCC) 10/21/2016  . Aspiration pneumonia (HCC) 10/21/2016  . Sepsis (HCC) 10/21/2016  . Transaminitis 10/18/2016  . Elevated troponin 10/18/2016  . Accidental drug overdose 10/17/2016  . Pulmonary edema, noncardiac 10/17/2016  . Acute on chronic respiratory failure with hypoxia (HCC) 10/17/2016    History reviewed. No pertinent surgical history.      Home Medications    Prior to Admission medications   Medication Sig Start Date End Date Taking? Authorizing  Provider  cloNIDine (CATAPRES) 0.1 MG tablet Take 1 tablet (0.1 mg total) by mouth daily. For withdrawal symptoms Patient not taking: Reported on 01/20/2017 11/23/16 11/28/16  Shaune Pollack, MD  dicyclomine (BENTYL) 20 MG tablet Take 1 tablet (20 mg total) by mouth 4 (four) times daily -  before meals and at bedtime. Patient not taking: Reported on 01/20/2017 11/23/16 11/28/16  Shaune Pollack, MD  gabapentin (NEURONTIN) 300 MG capsule 600mg  by mouth q AM and 900mg  by mouth q PM 09/17/17   Eber Hong, MD  loperamide (IMODIUM) 2 MG capsule Take 1 capsule (2 mg total) by mouth 4 (four) times daily as needed for diarrhea or loose stools. Patient not taking: Reported on 01/20/2017 11/23/16   Shaune Pollack, MD  ondansetron (ZOFRAN ODT) 4 MG disintegrating tablet Take 1 tablet (4 mg total) by mouth every 8 (eight) hours as needed for nausea or vomiting. Patient not taking: Reported on 01/20/2017 11/23/16   Shaune Pollack, MD  sertraline (ZOLOFT) 100 MG tablet Take 1.5 tablets (150 mg total) by mouth daily. 09/17/17   Eber Hong, MD  sertraline (ZOLOFT) 50 MG tablet Take 3 tablets (150 mg total) by mouth daily. 05/24/17   Mackuen, Cindee Salt, MD    Family History Family History  Problem Relation Age of Onset  . CAD Neg Hx   . Bleeding Disorder Neg Hx     Social History Social History   Tobacco Use  . Smoking status: Current Every Day Smoker    Packs/day: 2.00    Types: Cigarettes  . Smokeless tobacco: Never  Used  Substance Use Topics  . Alcohol use: No  . Drug use: Yes    Types: Cocaine, Marijuana    Comment: Fentyanl      Allergies   Sulfa antibiotics   Review of Systems Review of Systems  Constitutional: Negative for appetite change and fatigue.  HENT: Negative for congestion, ear discharge and sinus pressure.   Eyes: Negative for discharge.  Respiratory: Negative for cough.   Cardiovascular: Negative for chest pain.  Gastrointestinal: Negative for abdominal pain and  diarrhea.  Genitourinary: Negative for frequency and hematuria.  Musculoskeletal: Negative for back pain.  Skin: Negative for rash.  Neurological: Negative for seizures and headaches.  Psychiatric/Behavioral: Negative for hallucinations.     Physical Exam Updated Vital Signs BP 128/72   Pulse 87   Ht 5\' 9"  (1.753 m)   Wt 72.6 kg (160 lb)   SpO2 96%   BMI 23.63 kg/m   Physical Exam  Constitutional: He is oriented to person, place, and time. He appears well-developed.  HENT:  Head: Normocephalic.  Eyes: Conjunctivae and EOM are normal. No scleral icterus.  Neck: Neck supple. No thyromegaly present.  Cardiovascular: Normal rate and regular rhythm. Exam reveals no gallop and no friction rub.  No murmur heard. Pulmonary/Chest: No stridor. He has no wheezes. He has no rales. He exhibits no tenderness.  Abdominal: He exhibits no distension. There is no tenderness. There is no rebound.  Musculoskeletal: Normal range of motion. He exhibits no edema.  Lymphadenopathy:    He has no cervical adenopathy.  Neurological: He is oriented to person, place, and time. He exhibits normal muscle tone. Coordination normal.  Mildly lethargic  Skin: No rash noted. No erythema.  Psychiatric: He has a normal mood and affect. His behavior is normal.     ED Treatments / Results  Labs (all labs ordered are listed, but only abnormal results are displayed) Labs Reviewed  CBC WITH DIFFERENTIAL/PLATELET - Abnormal; Notable for the following components:      Result Value   WBC 14.6 (*)    Neutro Abs 11.3 (*)    All other components within normal limits  COMPREHENSIVE METABOLIC PANEL - Abnormal; Notable for the following components:   Total Protein 6.3 (*)    All other components within normal limits  ETHANOL    EKG None  Radiology No results found.  Procedures Procedures (including critical care time)  Medications Ordered in ED Medications - No data to display   Initial Impression /  Assessment and Plan / ED Course  I have reviewed the triage vital signs and the nursing notes.  Pertinent labs & imaging results that were available during my care of the patient were reviewed by me and considered in my medical decision making (see chart for details).     CBC chemistries unremarkable.  Patient was observed for 4 hours and did not need any more Narcan.  Patient does not want any help for substance abuse.  He is referred to GI for his history of hepatitis C  Final Clinical Impressions(s) / ED Diagnoses   Final diagnoses:  Accidental drug overdose, initial encounter    ED Discharge Orders    None       Bethann BerkshireZammit, Rogen Porte, MD 10/08/17 1106

## 2017-10-08 NOTE — ED Notes (Signed)
O2 sats decreased to 91%. Patient was placed on O2 2L/min via [place] and sats increased to 95%.

## 2017-10-08 NOTE — ED Notes (Signed)
Patient awakes quickly when name called.

## 2017-12-05 ENCOUNTER — Emergency Department (HOSPITAL_COMMUNITY): Admission: EM | Admit: 2017-12-05 | Discharge: 2017-12-05 | Discharge status: Against Medical Advice | Payer: Self-pay

## 2017-12-05 NOTE — ED Notes (Signed)
Called pt for triage x1 No response 

## 2017-12-05 NOTE — ED Notes (Signed)
Pt called for triage x2. No response.  

## 2017-12-13 ENCOUNTER — Encounter (HOSPITAL_COMMUNITY): Payer: Self-pay | Admitting: Emergency Medicine

## 2017-12-13 ENCOUNTER — Inpatient Hospital Stay (HOSPITAL_COMMUNITY)
Admission: EM | Admit: 2017-12-13 | Discharge: 2017-12-16 | DRG: 918 | Disposition: A | Discharge status: Home / Self Care | Payer: Self-pay | Attending: Internal Medicine | Admitting: Internal Medicine

## 2017-12-13 ENCOUNTER — Other Ambulatory Visit: Payer: Self-pay

## 2017-12-13 DIAGNOSIS — F1910 Other psychoactive substance abuse, uncomplicated: Secondary | ICD-10-CM

## 2017-12-13 DIAGNOSIS — R000 Tachycardia, unspecified: Secondary | ICD-10-CM

## 2017-12-13 DIAGNOSIS — Z882 Allergy status to sulfonamides status: Secondary | ICD-10-CM

## 2017-12-13 DIAGNOSIS — T401X1A Poisoning by heroin, accidental (unintentional), initial encounter: Principal | ICD-10-CM | POA: Diagnosis present

## 2017-12-13 DIAGNOSIS — F1123 Opioid dependence with withdrawal: Secondary | ICD-10-CM | POA: Diagnosis present

## 2017-12-13 DIAGNOSIS — N179 Acute kidney failure, unspecified: Secondary | ICD-10-CM

## 2017-12-13 DIAGNOSIS — F15229 Other stimulant dependence with intoxication, unspecified: Secondary | ICD-10-CM | POA: Diagnosis present

## 2017-12-13 DIAGNOSIS — F17210 Nicotine dependence, cigarettes, uncomplicated: Secondary | ICD-10-CM | POA: Diagnosis present

## 2017-12-13 DIAGNOSIS — M6282 Rhabdomyolysis: Secondary | ICD-10-CM | POA: Diagnosis present

## 2017-12-13 DIAGNOSIS — E860 Dehydration: Secondary | ICD-10-CM | POA: Diagnosis present

## 2017-12-13 DIAGNOSIS — B182 Chronic viral hepatitis C: Secondary | ICD-10-CM | POA: Diagnosis present

## 2017-12-13 DIAGNOSIS — F1193 Opioid use, unspecified with withdrawal: Secondary | ICD-10-CM | POA: Diagnosis present

## 2017-12-13 DIAGNOSIS — F1490 Cocaine use, unspecified, uncomplicated: Secondary | ICD-10-CM | POA: Diagnosis present

## 2017-12-13 LAB — COMPREHENSIVE METABOLIC PANEL
ALBUMIN: 4.2 g/dL (ref 3.5–5.0)
ALK PHOS: 71 U/L (ref 38–126)
ALT: 86 U/L — AB (ref 0–44)
AST: 124 U/L — AB (ref 15–41)
Anion gap: 12 (ref 5–15)
BUN: 37 mg/dL — ABNORMAL HIGH (ref 6–20)
CALCIUM: 9.5 mg/dL (ref 8.9–10.3)
CO2: 25 mmol/L (ref 22–32)
CREATININE: 1.92 mg/dL — AB (ref 0.61–1.24)
Chloride: 100 mmol/L (ref 98–111)
GFR calc non Af Amer: 45 mL/min — ABNORMAL LOW (ref >60)
GFR, EST AFRICAN AMERICAN: 52 mL/min — AB (ref >60)
GLUCOSE: 113 mg/dL — AB (ref 70–99)
Potassium: 3.4 mmol/L — ABNORMAL LOW (ref 3.5–5.1)
SODIUM: 137 mmol/L (ref 135–145)
Total Bilirubin: 0.9 mg/dL (ref 0.3–1.2)
Total Protein: 7.5 g/dL (ref 6.5–8.1)

## 2017-12-13 LAB — CBC
HEMATOCRIT: 45.1 % (ref 39.0–52.0)
Hemoglobin: 16.3 g/dL (ref 13.0–17.0)
MCH: 33.7 pg (ref 26.0–34.0)
MCHC: 36.1 g/dL — ABNORMAL HIGH (ref 30.0–36.0)
MCV: 93.2 fL (ref 78.0–100.0)
Platelets: 208 10*3/uL (ref 150–400)
RBC: 4.84 MIL/uL (ref 4.22–5.81)
RDW: 11.8 % (ref 11.5–15.5)
WBC: 8.8 10*3/uL (ref 4.0–10.5)

## 2017-12-13 LAB — CK: Total CK: 2810 U/L — ABNORMAL HIGH (ref 49–397)

## 2017-12-13 LAB — ETHANOL: Alcohol, Ethyl (B): <10 mg/dL (ref <10)

## 2017-12-13 MED ORDER — LORAZEPAM 1 MG PO TABS: 2.0000 mg | ORAL_TABLET | Freq: Four times a day (QID) | ORAL | Status: DC | PRN

## 2017-12-13 MED ORDER — SODIUM CHLORIDE 0.9 % IV BOLUS
1000.0000 mL | Freq: Once | INTRAVENOUS | Status: AC
  Administered 2017-12-13: 1000 mL via INTRAVENOUS

## 2017-12-13 MED ORDER — ONDANSETRON HCL 4 MG PO TABS: 4.0000 mg | ORAL_TABLET | Freq: Four times a day (QID) | ORAL | Status: DC | PRN

## 2017-12-13 MED ORDER — LORAZEPAM 2 MG/ML IJ SOLN: 0.5000 mg | INTRAMUSCULAR | Status: DC | PRN

## 2017-12-13 MED ORDER — SODIUM CHLORIDE 0.9 % IV SOLN: INTRAVENOUS | Status: DC

## 2017-12-13 MED ORDER — HEPARIN SODIUM (PORCINE) 5000 UNIT/ML IJ SOLN
5000.0000 [IU] | Freq: Three times a day (TID) | INTRAMUSCULAR | Status: DC
  Administered 2017-12-14 (×2): 5000 [IU] via SUBCUTANEOUS
  Filled 2017-12-13 (×2): qty 1

## 2017-12-13 MED ORDER — LORAZEPAM 2 MG/ML IJ SOLN
1.0000 mg | Freq: Once | INTRAMUSCULAR | Status: AC
  Administered 2017-12-13: 1 mg via INTRAVENOUS
  Filled 2017-12-13: qty 1

## 2017-12-13 MED ORDER — CHLORDIAZEPOXIDE HCL 25 MG PO CAPS
25.0000 mg | ORAL_CAPSULE | Freq: Once | ORAL | Status: AC
  Administered 2017-12-13: 25 mg via ORAL
  Filled 2017-12-13: qty 1

## 2017-12-13 MED ORDER — BUPRENORPHINE HCL-NALOXONE HCL 2-0.5 MG SL SUBL
2.0000 | SUBLINGUAL_TABLET | Freq: Once | SUBLINGUAL | Status: AC
  Administered 2017-12-13: 2 via SUBLINGUAL

## 2017-12-13 MED ORDER — ONDANSETRON HCL 4 MG/2ML IJ SOLN: 4.0000 mg | Freq: Four times a day (QID) | INTRAMUSCULAR | Status: DC | PRN

## 2017-12-13 MED ORDER — SODIUM CHLORIDE 0.9 % IV SOLN
INTRAVENOUS | Status: AC
  Administered 2017-12-14 (×2): via INTRAVENOUS

## 2017-12-13 NOTE — ED Notes (Signed)
Pt with pocket knives (3) given to security.

## 2017-12-13 NOTE — ED Notes (Signed)
ED TO INPATIENT HANDOFF REPORT  Name/Age/Gender George Walls 32 y.o. male  Code Status    Code Status Orders  (From admission, onward)         Start     Ordered   12/13/17 2215  Full code  Continuous     12/13/17 2216        Code Status History    Date Active Date Inactive Code Status Order ID Comments User Context   01/20/2017 0007 01/20/2017 1652 Full Code [id number]  Orpah Greek, MD ED   11/28/2016 0043 11/28/2016 1458 Full Code [id number]  Domenic Moras, PA-C ED   11/24/2016 2349 11/25/2016 1502 Full Code [id number]  Ward, Delice Bison, DO ED   11/23/2016 0134 11/23/2016 1423 Full Code [id number]  Ward, Delice Bison, DO ED   10/21/2016 0307 10/23/2016 2146 Full Code [id number]  Etta Quill, DO ED   10/17/2016 1046 10/18/2016 1232 Full Code [id number]  Reyne Dumas, MD ED      Home/SNF/Other Home  Chief Complaint Detox   Level of Care/Admitting Diagnosis ED Disposition    ED Disposition Condition [place]: [facility] [100102]  Level of Care: Med-Surg [16]  Diagnosis: AKI (acute kidney injury) [facility]) [277412]  Admitting Physician: Colbert Ewing [[id number]]  Attending Physician: Colbert Ewing [[id number]]  PT Class (Do Not Modify): Observation [104]  PT Acc Code (Do Not Modify): Observation [10022]       Medical History Past Medical History:  Diagnosis Date  . Hepatitis C   . IVDU (intravenous drug user)   . Polysubstance dependence including opioid type drug with complication, episodic abuse (HCC)     Allergies Allergies  Allergen Reactions  . Sulfa Antibiotics Other (See Comments)    Reaction:  Unknown     IV Location/Drains/Wounds Patient Lines/Drains/Airways Status   Active Line/Drains/Airways    Name:   Placement date:   Placement time:   Site:   Days:   Peripheral IV 12/13/17 Right Forearm   12/13/17    1948    Forearm   less than 1          Labs/Imaging Results for orders placed or  performed during the hospital encounter of 12/13/17 (from the past 48 hour(s))  Comprehensive metabolic panel     Status: Abnormal   Collection Time: 12/13/17  7:47 PM  Result Value Ref Range   Sodium 137 135 - 145 mmol/L   Potassium 3.4 (L) 3.5 - 5.1 mmol/L   Chloride 100 98 - 111 mmol/L   CO2 25 22 - 32 mmol/L   Glucose, Bld 113 (H) 70 - 99 mg/dL   BUN 37 (H) 6 - 20 mg/dL   Creatinine, Ser 1.92 (H) 0.61 - 1.24 mg/dL   Calcium 9.5 8.9 - 10.3 mg/dL   Total Protein 7.5 6.5 - 8.1 g/dL   Albumin 4.2 3.5 - 5.0 g/dL   AST 124 (H) 15 - 41 U/L   ALT 86 (H) 0 - 44 U/L   Alkaline Phosphatase 71 38 - 126 U/L   Total Bilirubin 0.9 0.3 - 1.2 mg/dL   GFR calc non Af Amer 45 (L) >60 mL/min   GFR calc Af Amer 52 (L) >60 mL/min    Comment: (NOTE) The eGFR has been calculated using the CKD EPI equation. This calculation has not been validated in all clinical situations. eGFR's persistently <60 mL/min signify possible Chronic Kidney Disease.    Anion gap 12 5 -  15    Comment: Performed at [facility], [place] [address], [place], [place] [address]  Ethanol     Status: None   Collection Time: 12/13/17  7:47 PM  Result Value Ref Range   Alcohol, Ethyl (B) <10 <10 mg/dL    Comment: (NOTE) Lowest detectable limit for serum alcohol is 10 mg/dL. For medical purposes only. Performed at [facility], [place] [address] [place], [place] [address]   cbc     Status: Abnormal   Collection Time: 12/13/17  7:47 PM  Result Value Ref Range   WBC 8.8 4.0 - 10.5 K/uL   RBC 4.84 4.22 - 5.81 MIL/uL   Hemoglobin 16.3 13.0 - 17.0 g/dL   HCT 45.1 39.0 - 52.0 %   MCV 93.2 78.0 - 100.0 fL   MCH 33.7 26.0 - 34.0 pg   MCHC 36.1 (H) 30.0 - 36.0 g/dL   RDW 11.8 11.5 - 15.5 %   Platelets 208 150 - 400 K/uL    Comment: Performed at [facility], [place] [address], [place], [place] [address]  CK     Status: Abnormal   Collection Time: 12/13/17  7:47 PM  Result Value  Ref Range   Total CK 2,810 (H) 49 - 397 U/L    Comment: Performed at [facility], [place] [address] [place], [place] [address]   No results found.  Pending Labs Unresulted Labs (From admission, onward)    Start     Ordered   12/14/17 0600  CK  Tomorrow morning,   R     12/13/17 2240   12/14/17 1657  Basic metabolic panel  Tomorrow morning,   R     12/13/17 2216   12/14/17 0500  CBC  Tomorrow morning,   R     12/13/17 2216   12/13/17 2216  Urine rapid drug screen (hosp performed)  Once,   R     12/13/17 2216   12/13/17 2213  HIV antibody (Routine Testing)  Once,   R     12/13/17 2216   12/13/17 1824  Rapid urine drug screen (hospital performed)  Once,   STAT     12/13/17 1823          Vitals/Pain Today's Vitals   12/13/17 1840 12/13/17 2030 12/13/17 2045 12/13/17 2100  BP:  96/62  91/60  Pulse:  (!) 101 (!) 104 (!) 104  Resp:  12 17 16   Temp: 98.1 F (36.7 C)     TempSrc: Oral     SpO2:  95% 95% 92%  PainSc:        Isolation Precautions No active isolations  Medications Medications  ondansetron (ZOFRAN) tablet 4 mg (has no administration in time range)    Or  ondansetron (ZOFRAN) injection 4 mg (has no administration in time range)  LORazepam (ATIVAN) tablet 2 mg (has no administration in time range)  heparin injection 5,000 Units (has no administration in time range)  sodium chloride 0.9 % bolus 1,000 mL (1,000 mLs Intravenous Transfusing/Transfer 12/13/17 2253)  0.9 %  sodium chloride infusion (has no administration in time range)  buprenorphine-naloxone (SUBOXONE) 2-0.5 mg per SL tablet 2 tablet (2 tablets Sublingual Given 12/13/17 2015)  LORazepam (ATIVAN) injection 1 mg (1 mg Intravenous Given 12/13/17 2014)  sodium chloride 0.9 % bolus 1,000 mL (0 mLs Intravenous Stopped 12/13/17 2118)  chlordiazePOXIDE (LIBRIUM) capsule 25 mg (25 mg Oral Given 12/13/17 2015)  sodium chloride 0.9 % bolus  1,000 mL (0 mLs Intravenous Stopped 12/13/17 2233)     Mobility walks with person assist

## 2017-12-13 NOTE — ED Triage Notes (Signed)
Pt states he had been clean for 90 days prior to Thursday, 3 days ago. Pt recently used heroin and meth and some alcohol. Pt agitated and feeling like sores on his face "are going to explode". Pt is fidgeting with face in triage, tachycardic, nausea, vomiting and feeling hot.

## 2017-12-13 NOTE — ED Notes (Signed)
Unable to obtain IV access at this time. Will obtain IV ultrasound access from another RN.

## 2017-12-13 NOTE — ED Provider Notes (Signed)
[facility]-EMERGENCY DEPT Provider Note   CSN: [id number] Arrival date & time: 12/13/17  1753     History   Chief Complaint Chief Complaint  Patient presents with  . drug withdrawl  . detox    HPI George ButtsChristopher Walls is a 32 y.o. male.  32 yo M with a chief complaint of withdrawing.  Patient thinks he is withdrawing mostly from methamphetamines though also has been recently doing heroin.  He is having hot and cold flashes muscle aches tremors feeling like his heart is racing some transient nausea and vomiting.  Some piloerection.  Denies fevers or chills.  Denies cough or congestion.  Denies abdominal pain.  Has had some vomiting and loose stools.  Denies any concerning skin lesions.  The history is provided by the patient.  Illness  This is a new problem. The current episode started more than 2 days ago. The problem occurs constantly. The problem has been rapidly worsening. Pertinent negatives include no chest pain, no abdominal pain, no headaches and no shortness of breath. Nothing aggravates the symptoms. Nothing relieves the symptoms. He has tried nothing for the symptoms. The treatment provided no relief.    Past Medical History:  Diagnosis Date  . Hepatitis C   . IVDU (intravenous drug user)   . Polysubstance dependence including opioid type drug with complication, episodic abuse [facility](HCC)     Patient Active Problem List   Diagnosis Date Noted  . AKI (acute kidney injury) (HCC) 12/13/2017  . Opioid withdrawal (HCC) 12/13/2017  . Rhabdomyolysis 12/13/2017  . Cocaine abuse with cocaine-induced mood disorder (HCC) 01/20/2017  . Substance induced mood disorder (HCC) 11/25/2016  . Hepatitis C 10/23/2016  . Bronchitis, acute 10/23/2016  . Dental caries 10/23/2016  . Polysubstance dependence including opioid type drug with complication, episodic abuse (HCC) 10/21/2016  . Aspiration pneumonia (HCC) 10/21/2016  . Sepsis (HCC) 10/21/2016  . Transaminitis  10/18/2016  . Elevated troponin 10/18/2016  . Accidental drug overdose 10/17/2016  . Pulmonary edema, noncardiac 10/17/2016  . Acute on chronic respiratory failure with hypoxia (HCC) 10/17/2016    History reviewed. No pertinent surgical history.      Home Medications    Prior to Admission medications   Medication Sig Start Date End Date Taking? Authorizing Provider  cloNIDine (CATAPRES) 0.1 MG tablet Take 1 tablet (0.1 mg total) by mouth daily. For withdrawal symptoms 11/23/16 12/13/17 Yes Shaune PollackIsaacs, Cameron, MD  gabapentin (NEURONTIN) 300 MG capsule 600mg  by mouth q AM and 900mg  by mouth q PM Patient taking differently: Take 600-900 mg by mouth See admin instructions. 600mg  by mouth q AM and 900mg  by mouth q PM 09/17/17  Yes Eber HongMiller, Brian, MD  sertraline (ZOLOFT) 100 MG tablet Take 1.5 tablets (150 mg total) by mouth daily. 09/17/17  Yes Eber HongMiller, Brian, MD  loperamide (IMODIUM) 2 MG capsule Take 1 capsule (2 mg total) by mouth 4 (four) times daily as needed for diarrhea or loose stools. Patient not taking: Reported on 01/20/2017 11/23/16   Shaune PollackIsaacs, Cameron, MD  ondansetron (ZOFRAN ODT) 4 MG disintegrating tablet Take 1 tablet (4 mg total) by mouth every 8 (eight) hours as needed for nausea or vomiting. Patient not taking: Reported on 01/20/2017 11/23/16   Shaune PollackIsaacs, Cameron, MD  sertraline (ZOLOFT) 50 MG tablet Take 3 tablets (150 mg total) by mouth daily. Patient not taking: Reported on 12/13/2017 05/24/17   Abelino DerrickMackuen, Courteney Lyn, MD    Family History Family History  Problem Relation Age of Onset  . CAD  Neg Hx   . Bleeding Disorder Neg Hx     Social History Social History   Tobacco Use  . Smoking status: Current Every Day Smoker    Packs/day: 2.00    Types: Cigarettes  . Smokeless tobacco: Never Used  Substance Use Topics  . Alcohol use: No  . Drug use: Yes    Types: Cocaine, Marijuana    Comment: Fentyanl      Allergies   Sulfa antibiotics   Review of Systems Review of  Systems  Constitutional: Positive for chills. Negative for fever.  HENT: Negative for congestion and facial swelling.   Eyes: Negative for discharge and visual disturbance.  Respiratory: Negative for shortness of breath.   Cardiovascular: Positive for palpitations. Negative for chest pain.  Gastrointestinal: Positive for diarrhea, nausea and vomiting. Negative for abdominal pain.  Musculoskeletal: Positive for arthralgias. Negative for myalgias.  Skin: Negative for color change and rash.  Neurological: Positive for tremors. Negative for syncope and headaches.  Psychiatric/Behavioral: Negative for confusion and dysphoric mood.     Physical Exam Updated Vital Signs BP (!) 98/59   Pulse (!) 104   Temp 98.1 F (36.7 C) (Oral)   Resp 15   SpO2 91%   Physical Exam  Constitutional: He is oriented to person, place, and time. He appears well-developed and well-nourished.  HENT:  Head: Normocephalic and atraumatic.  Eyes: Pupils are equal, round, and reactive to light. EOM are normal.  Neck: Normal range of motion. Neck supple. No JVD present.  Cardiovascular: Regular rhythm. Tachycardia present. Exam reveals no gallop and no friction rub.  No murmur heard. Pulmonary/Chest: No respiratory distress. He has no wheezes.  Abdominal: He exhibits no distension. There is no rebound and no guarding.  Musculoskeletal: Normal range of motion.  Neurological: He is alert and oriented to person, place, and time.  tremulous  Skin: No rash noted. No pallor.  Psychiatric: He has a normal mood and affect. His behavior is normal.  Nursing note and vitals reviewed.    ED Treatments / Results  Labs (all labs ordered are listed, but only abnormal results are displayed) Labs Reviewed  COMPREHENSIVE METABOLIC PANEL - Abnormal; Notable for the following components:      Result Value   Potassium 3.4 (*)    Glucose, Bld 113 (*)    BUN 37 (*)    Creatinine, Ser 1.92 (*)    AST 124 (*)    ALT 86 (*)      GFR calc non Af Amer 45 (*)    GFR calc Af Amer 52 (*)    All other components within normal limits  CBC - Abnormal; Notable for the following components:   MCHC 36.1 (*)    All other components within normal limits  CK - Abnormal; Notable for the following components:   Total CK 2,810 (*)    All other components within normal limits  ETHANOL  RAPID URINE DRUG SCREEN, HOSP PERFORMED  HIV ANTIBODY (ROUTINE TESTING W REFLEX)  BASIC METABOLIC PANEL  CBC  RAPID URINE DRUG SCREEN, HOSP PERFORMED  CK    EKG EKG Interpretation  Date/Time:  Sunday December 13 2017 18:15:47 EDT Ventricular Rate:  127 PR Interval:    QRS Duration: 85 QT Interval:  315 QTC Calculation: 458 R Axis:   77 Text Interpretation:  Sinus tachycardia Baseline wander in lead(s) I No significant change since last tracing Confirmed by Melene Plan [phone]) on 12/13/2017 7:11:07 PM   Radiology No results found.  Procedures Procedures (including critical care time)  Medications Ordered in ED Medications  ondansetron (ZOFRAN) tablet 4 mg (has no administration in time range)    Or  ondansetron (ZOFRAN) injection 4 mg (has no administration in time range)  LORazepam (ATIVAN) tablet 2 mg (has no administration in time range)  heparin injection 5,000 Units (has no administration in time range)  0.9 %  sodium chloride infusion (has no administration in time range)  LORazepam (ATIVAN) injection 0.5 mg (has no administration in time range)  buprenorphine-naloxone (SUBOXONE) 2-0.5 mg per SL tablet 2 tablet (2 tablets Sublingual Given 12/13/17 2015)  LORazepam (ATIVAN) injection 1 mg (1 mg Intravenous Given 12/13/17 2014)  sodium chloride 0.9 % bolus 1,000 mL (0 mLs Intravenous Stopped 12/13/17 2118)  chlordiazePOXIDE (LIBRIUM) capsule 25 mg (25 mg Oral Given 12/13/17 2015)  sodium chloride 0.9 % bolus 1,000 mL (0 mLs Intravenous Stopped 12/13/17 2233)  sodium chloride 0.9 % bolus 1,000 mL (1,000 mLs Intravenous  Transfusing/Transfer 12/13/17 2253)     Initial Impression / Assessment and Plan / ED Course  I have reviewed the triage vital signs and the nursing notes.  Pertinent labs & imaging results that were available during my care of the patient were reviewed by me and considered in my medical decision making (see chart for details).     32 yo M with a chief complaint of multidrug withdrawal.  Last used about 3 days ago.  Patient's heart rate is in the 130s on arrival.  Will give a bag of fluids check basic labs.  Give Ativan Librium and Suboxone and reassess.  The patient has had some improvement of his symptoms however he remains tachycardic.  His heart rate went from the 130s down to the low 100s.  He feels somewhat better and is resting comfortably.  His creatinine has gone from a baseline of 0.8-1.92.  With this acute kidney injury and persistent tachycardia will discuss with the hospitalist for admission.  The patients results and plan were reviewed and discussed.   Any x-rays performed were independently reviewed by myself.   Differential diagnosis were considered with the presenting HPI.  Medications  ondansetron (ZOFRAN) tablet 4 mg (has no administration in time range)    Or  ondansetron (ZOFRAN) injection 4 mg (has no administration in time range)  LORazepam (ATIVAN) tablet 2 mg (has no administration in time range)  heparin injection 5,000 Units (has no administration in time range)  0.9 %  sodium chloride infusion (has no administration in time range)  LORazepam (ATIVAN) injection 0.5 mg (has no administration in time range)  buprenorphine-naloxone (SUBOXONE) 2-0.5 mg per SL tablet 2 tablet (2 tablets Sublingual Given 12/13/17 2015)  LORazepam (ATIVAN) injection 1 mg (1 mg Intravenous Given 12/13/17 2014)  sodium chloride 0.9 % bolus 1,000 mL (0 mLs Intravenous Stopped 12/13/17 2118)  chlordiazePOXIDE (LIBRIUM) capsule 25 mg (25 mg Oral Given 12/13/17 2015)  sodium chloride 0.9 %  bolus 1,000 mL (0 mLs Intravenous Stopped 12/13/17 2233)  sodium chloride 0.9 % bolus 1,000 mL (1,000 mLs Intravenous Transfusing/Transfer 12/13/17 2253)    Vitals:   12/13/17 2145 12/13/17 2200 12/13/17 2215 12/13/17 2230  BP:  (!) 91/54  (!) 98/59  Pulse: (!) 108 (!) 103 (!) 103 (!) 104  Resp: 20 15 16 15   Temp:      TempSrc:      SpO2: (!) 85% 93% 92% 91%    Final diagnoses:  AKI (acute kidney injury) (HCC)  Sinus tachycardia  Admission/ observation were discussed with the admitting physician, patient and/or family and they are comfortable with the plan.    Final Clinical Impressions(s) / ED Diagnoses   Final diagnoses:  AKI (acute kidney injury) (HCC)  Sinus tachycardia    ED Discharge Orders    None       Melene Plan, DO 12/14/17 0002

## 2017-12-13 NOTE — H&P (Signed)
History and Physical  George Walls ZOX:[id number] DOB: Apr 07, 1985 DOA: 12/13/2017 1828  Referring physician: Dr. Edward Jolly (ED)  HISTORY   Chief Complaint: withdrawal   HPI: George Walls is a 32 y.o. male with hx of IV polysubstance abuse, HCV, and previous ED visit for unintentional IV opioid overdose who presented to ED reporting agitation and feeling unwell -- per ED triage note "feeling like sores on his face 'are going to explode.'" Noted to be tachycardic to 100s. When I attempted to interview the patient, he was somnolent and not responding to questions, barely arousable to sternal rub and not awakening to water splashed on his face. Previous ED visits to [facility] noted in July (for fentanyl overdose requiring narcan by EMS en route) and Sep 7 this year (although no documentation detailing the Sep ED visit).     Review of Systems:  - unable to obtain from patient   - per ED patient had been tremulous and restless, feeling "hot" upon arrival  ED course:  Vitals Blood pressure 91/60, pulse (!) 104, temperature 98.1 F (36.7 C), temperature source Oral, resp. rate 16, SpO2 92 %. Received ativan IV 1mg  x1; suboxone 2-0.5mg (2 tablets); librium 25mg  x 1; NS bolus x 2  Past Medical History:  Diagnosis Date  . Hepatitis C   . IVDU (intravenous drug user)   . Polysubstance dependence including opioid type drug with complication, episodic abuse (HCC)    History reviewed. No pertinent surgical history.  Social History:  reports that he has been smoking cigarettes. He has been smoking about 2.00 packs per day. He has never used smokeless tobacco. He reports that he has current or past drug history. Drugs: Cocaine and Marijuana. He reports that he does not drink alcohol.  Allergies  Allergen Reactions  . Sulfa Antibiotics Other (See Comments)    Reaction:  Unknown     Family History  Problem Relation Age of Onset  . CAD Neg Hx   . Bleeding Disorder Neg Hx       Prior  to Admission medications   Medication Sig Start Date End Date Taking? Authorizing Provider  cloNIDine (CATAPRES) 0.1 MG tablet Take 1 tablet (0.1 mg total) by mouth daily. For withdrawal symptoms 11/23/16 12/13/17 Yes Shaune Pollack, MD  gabapentin (NEURONTIN) 300 MG capsule 600mg  by mouth q AM and 900mg  by mouth q PM Patient taking differently: Take 600-900 mg by mouth See admin instructions. 600mg  by mouth q AM and 900mg  by mouth q PM 09/17/17  Yes Eber Hong, MD  sertraline (ZOLOFT) 100 MG tablet Take 1.5 tablets (150 mg total) by mouth daily. 09/17/17  Yes Eber Hong, MD  loperamide (IMODIUM) 2 MG capsule Take 1 capsule (2 mg total) by mouth 4 (four) times daily as needed for diarrhea or loose stools. Patient not taking: Reported on 01/20/2017 11/23/16   Shaune Pollack, MD  ondansetron (ZOFRAN ODT) 4 MG disintegrating tablet Take 1 tablet (4 mg total) by mouth every 8 (eight) hours as needed for nausea or vomiting. Patient not taking: Reported on 01/20/2017 11/23/16   Shaune Pollack, MD  sertraline (ZOLOFT) 50 MG tablet Take 3 tablets (150 mg total) by mouth daily. Patient not taking: Reported on 12/13/2017 05/24/17   Mackuen, Courteney Lyn, MD    PHYSICAL EXAM   Temp:  [98.1 F (36.7 C)] 98.1 F (36.7 C) (09/15 1840) Pulse Rate:  [101-130] 104 (09/15 2100) Resp:  [12-30] 16 (09/15 2100) BP: (91-96)/(60-62) 91/60 (09/15 2100) SpO2:  [92 %-100 %]  92 % (09/15 2100)  BP 91/60   Pulse (!) 104   Temp 98.1 F (36.7 C) (Oral)   Resp 16   SpO2 92%    GEN well-nourished young caucasian male; not responding to questions; withdraws from sternal rub  HEENT NCAT; pupils constricted and equal pupillary reflex intact b/l; clear oropharynx, no cervical LAD; dry mucus membranes  JVP estimated 5 cm H2O above RA; no HJR ; no carotid bruits b/l ;  CV regular tachycardic; normal S1 and S2; 1/6 systolic ejection murmur at LUSB; no S3/S4; PMI non displaced; no parasternal heave  RESP CTA b/l;  breathing unlabored and symmetric  ABD soft NT ND +normoactive BS  EXT warm throughout b/l; no peripheral edema b/l  PULSES  DP and radials intact b/l  SKIN/MSK  Few scratches on left forearm; soft wrist brace in place on L wrist/hand; no skin popping or puncture wounds visible on exposed extremities. Did not examine full body NEURO/PSYCH somnolent, not responding to questions as above and minimally responsive to painful stimuli  DATA   LABS ON ADMISSION:  Basic Metabolic Panel: Recent Labs  Lab 12/13/17 1947  NA 137  K 3.4*  CL 100  CO2 25  GLUCOSE 113*  BUN 37*  CREATININE 1.92*  CALCIUM 9.5   CBC: Recent Labs  Lab 12/13/17 1947  WBC 8.8  HGB 16.3  HCT 45.1  MCV 93.2  PLT 208   Liver Function Tests: Recent Labs  Lab 12/13/17 1947  AST 124*  ALT 86*  ALKPHOS 71  BILITOT 0.9  PROT 7.5  ALBUMIN 4.2   No results for input(s): LIPASE, AMYLASE in the last 168 hours. No results for input(s): AMMONIA in the last 168 hours. Coagulation:  No results found for: INR, PROTIME No results found for: PTT Lactic Acid, Venous:     Component Value Date/Time   LATICACIDVEN 0.95 10/21/2016 0024   Cardiac Enzymes: Recent Labs  Lab 12/13/17 1947  CKTOTAL 2,810*   Urinalysis: No results found for: COLORURINE, APPEARANCEUR, LABSPEC, PHURINE, GLUCOSEU, HGBUR, BILIRUBINUR, KETONESUR, PROTEINUR, UROBILINOGEN, NITRITE, LEUKOCYTESUR  BNP (last 3 results) No results for input(s): PROBNP in the last 8760 hours. CBG: No results for input(s): GLUCAP in the last 168 hours.  Radiological Exams on Admission: No results found.  EKG: Independently reviewed. Sinus tachycardia at 127 bpm   ASSESSMENT AND PLAN   Assessment: George Walls is a 32 y.o. male with hx of HCV and active polysubstance (IV heroin and meth) abuse presented with initial sx suggestive of either withdrawal or methamphetamine intoxication. Has unintentionally overdosed on fentanyl in the past year.  Upon my exam, patient was somnolent -- cannot rule out if patient had used heroin just prior to arrival. He had also received IV ativan, librium, and suboxone in the ED although the IV ativan dose was only 1mg . Urine tox pending. Labs are significant for CK 2800 and new AKI with Cr 1.9. Will  Continue supportive tx with IVF and admit to observation to monitor renal function.    Present on Admission: . AKI (acute kidney injury) (HCC) . Opioid withdrawal (HCC) . Rhabdomyolysis   Plan:   # Withdrawal +/- acute meth intoxication > somnolent during my exam; resp status intact; had been reportedly agitated during initial ED exam - hold off on further standing doses of anxiolytics until patient is more awake - at this time given somnolence low threshold to use narcan - if patient remains somnolent or has recurring episodes of somnolence unrelated to  iatrogenic benzo, low threshold to search for any paraphernalia and use narcan as above - when patient awakens prn PO ativan 2mg  q6h for agitation/withdrawal - spot dose low dose IV ativan at 0.5mg  only if severely agitated - urine tox pending   # Rhabdomyolysis and AKI Cr 1.9 (normal baseline 0.8-1.0) and elevated CK 2800s > AKI likely 2/2 rhabdo + dehydration   - ordered another NS bolus (s/p 2L in ED) and start NS at 150cc/hr - repeat BMP and CK in AM   DVT Prophylaxis: hep subq to start tomorrow Code Status:  Full Code Family Communication: left voicemail for mother Eunice Blase) to callback  Disposition Plan: admit to observation to monitor renal function  Patient contact: Extended Emergency Contact Information Primary Emergency Contact: Barbee [place] Mobile Phone: [phone] Relation: Mother  Time spent: > 35 minutes  Ike Bene, MD [facility] Pager [phone]  If 7PM-7AM, please contact night-coverage [email or web address] Password [facility] 12/13/2017, 10:17 PM

## 2017-12-14 ENCOUNTER — Other Ambulatory Visit: Payer: Self-pay

## 2017-12-14 DIAGNOSIS — M6282 Rhabdomyolysis: Secondary | ICD-10-CM

## 2017-12-14 DIAGNOSIS — F13239 Sedative, hypnotic or anxiolytic dependence with withdrawal, unspecified: Secondary | ICD-10-CM

## 2017-12-14 DIAGNOSIS — F1910 Other psychoactive substance abuse, uncomplicated: Secondary | ICD-10-CM

## 2017-12-14 LAB — COMPREHENSIVE METABOLIC PANEL
ALK PHOS: 48 U/L (ref 38–126)
ALT: 67 U/L — AB (ref 0–44)
AST: 76 U/L — ABNORMAL HIGH (ref 15–41)
Albumin: 3.1 g/dL — ABNORMAL LOW (ref 3.5–5.0)
Anion gap: 9 (ref 5–15)
BILIRUBIN TOTAL: 0.8 mg/dL (ref 0.3–1.2)
BUN: 31 mg/dL — ABNORMAL HIGH (ref 6–20)
CALCIUM: 8.1 mg/dL — AB (ref 8.9–10.3)
CO2: 23 mmol/L (ref 22–32)
CREATININE: 1.02 mg/dL (ref 0.61–1.24)
Chloride: 106 mmol/L (ref 98–111)
Glucose, Bld: 91 mg/dL (ref 70–99)
Potassium: 4.3 mmol/L (ref 3.5–5.1)
Sodium: 138 mmol/L (ref 135–145)
TOTAL PROTEIN: 6 g/dL — AB (ref 6.5–8.1)

## 2017-12-14 LAB — RAPID URINE DRUG SCREEN, HOSP PERFORMED
Amphetamines: POSITIVE — AB
Barbiturates: NOT DETECTED
Benzodiazepines: POSITIVE — AB
Cocaine: POSITIVE — AB
OPIATES: POSITIVE — AB
Tetrahydrocannabinol: NOT DETECTED

## 2017-12-14 LAB — CBC WITH DIFFERENTIAL/PLATELET
BASOS ABS: 0 10*3/uL (ref 0.0–0.1)
BASOS PCT: 0 %
EOS PCT: 3 %
Eosinophils Absolute: 0.3 10*3/uL (ref 0.0–0.7)
HEMATOCRIT: 39.8 % (ref 39.0–52.0)
HEMOGLOBIN: 14.3 g/dL (ref 13.0–17.0)
LYMPHS ABS: 1.6 10*3/uL (ref 0.7–4.0)
Lymphocytes Relative: 16 %
MCH: 33.2 pg (ref 26.0–34.0)
MCHC: 35.9 g/dL (ref 30.0–36.0)
MCV: 92.3 fL (ref 78.0–100.0)
Monocytes Absolute: 0.5 10*3/uL (ref 0.1–1.0)
Monocytes Relative: 5 %
NEUTROS ABS: 7.9 10*3/uL — AB (ref 1.7–7.7)
Neutrophils Relative %: 76 %
Platelets: 173 10*3/uL (ref 150–400)
RBC: 4.31 MIL/uL (ref 4.22–5.81)
RDW: 12.3 % (ref 11.5–15.5)
WBC: 10.3 10*3/uL (ref 4.0–10.5)

## 2017-12-14 LAB — MAGNESIUM: MAGNESIUM: 1.8 mg/dL (ref 1.7–2.4)

## 2017-12-14 LAB — CK: Total CK: 946 U/L — ABNORMAL HIGH (ref 49–397)

## 2017-12-14 MED ORDER — HALOPERIDOL LACTATE 5 MG/ML IJ SOLN: 2.0000 mg | Freq: Four times a day (QID) | INTRAMUSCULAR | Status: DC | PRN

## 2017-12-14 MED ORDER — GABAPENTIN 300 MG PO CAPS
300.0000 mg | ORAL_CAPSULE | Freq: Three times a day (TID) | ORAL | Status: DC
  Administered 2017-12-14 – 2017-12-16 (×6): 300 mg via ORAL
  Filled 2017-12-14 (×6): qty 1

## 2017-12-14 MED ORDER — SODIUM CHLORIDE 0.9 % IV SOLN: INTRAVENOUS | Status: DC

## 2017-12-14 MED ORDER — SODIUM CHLORIDE 0.9 % IV SOLN
INTRAVENOUS | Status: DC
  Administered 2017-12-14 – 2017-12-15 (×3): via INTRAVENOUS

## 2017-12-14 MED ORDER — LORAZEPAM 2 MG/ML IJ SOLN
1.0000 mg | INTRAMUSCULAR | Status: DC | PRN
  Administered 2017-12-14 (×2): 1 mg via INTRAVENOUS
  Filled 2017-12-14 (×2): qty 1

## 2017-12-14 NOTE — Progress Notes (Signed)
Received George Walls, new admission, to room 1612. No acute distress noted. Easily aroused to name. Noted to be somnolent, easily agitated and not responding to questions. See CIWA assessments. Unable to do H&P at this time. Will continue to monitor and assess this patient needs and concerns.

## 2017-12-14 NOTE — Progress Notes (Signed)
Patient ID: George Walls, male   DOB: 06-Jan-1986, 32 y.o.   MRN: [id number]  PROGRESS NOTE    George Walls  ION:[id number] DOB: Aug 21, 1985 DOA: 12/13/2017 PCP: Patient, No Pcp Per   Brief Narrative:  32 year old male with history of polysubstance abuse, HCV, opiate overdose presented on 12/13/2017 with chief complaint of withdrawal.  He was given Ativan, Librium, Suboxone in the ED.  He was started on intravenous fluids for acute kidney injury and elevated CK.   Assessment & Plan:   Principal Problem:   AKI (acute kidney injury) (HCC) Active Problems:   Opioid withdrawal (HCC)   Rhabdomyolysis   Acute kidney injury and rhabdomyolysis -Probably secondary to dehydration -Follow labs this a.m. -Continue IV fluids and repeat a.m. labs  Polysubstance abuse with intoxication and withdrawal -Patient states that he uses methamphetamine, heroin and occasional cocaine.  Urine drug screen is pending. -Monitor for withdrawal -Social worker consult  Chronic hepatitis C -Outpatient follow-up with PCP  Elevated LFTs -Probably secondary to hepatitis C versus drug abuse.  Repeat a.m. labs  DVT prophylaxis: Heparin Code Status: Full Family Communication: None at bedside Disposition Plan: Home in 1 to 2 days if clinically improves  Consultants: None  Procedures: None  Antimicrobials: None   Subjective: Patient seen and examined at bedside.  He is awake but still drowsy and slightly confused.  No overnight fever, nausea or vomiting reported by the nursing staff  Objective: Vitals:   12/13/17 2215 12/13/17 2230 12/13/17 2300 12/14/17 0459  BP:  (!) 98/59 94/60 104/68  Pulse: (!) 103 (!) 104 98 92  Resp: 16 15 18 17   Temp:   98.3 F (36.8 C) 98.1 F (36.7 C)  TempSrc:   Oral Oral  SpO2: 92% 91% 94% 96%    Intake/Output Summary (Last 24 hours) at 12/14/2017 0959 Last data filed at 12/13/2017 2233 Gross per 24 hour  Intake 2000 ml  Output -  Net 2000 ml    There were no vitals filed for this visit.  Examination:  General exam: Appears drowsy, wakes up slightly on calling his name.  Still confused.  No agitation currently Respiratory system: Bilateral decreased breath sounds at bases Cardiovascular system: S1 & S2 heard, Rate controlled at the time of my exam Gastrointestinal system: Abdomen is nondistended, soft and nontender. Normal bowel sounds heard. Extremities: No cyanosis, clubbing, edema    Data Reviewed: I have personally reviewed following labs and imaging studies  CBC: Recent Labs  Lab 12/13/17 1947  WBC 8.8  HGB 16.3  HCT 45.1  MCV 93.2  PLT 208   Basic Metabolic Panel: Recent Labs  Lab 12/13/17 1947  NA 137  K 3.4*  CL 100  CO2 25  GLUCOSE 113*  BUN 37*  CREATININE 1.92*  CALCIUM 9.5   GFR: CrCl cannot be calculated (Unknown ideal weight.). Liver Function Tests: Recent Labs  Lab 12/13/17 1947  AST 124*  ALT 86*  ALKPHOS 71  BILITOT 0.9  PROT 7.5  ALBUMIN 4.2   No results for input(s): LIPASE, AMYLASE in the last 168 hours. No results for input(s): AMMONIA in the last 168 hours. Coagulation Profile: No results for input(s): INR, PROTIME in the last 168 hours. Cardiac Enzymes: Recent Labs  Lab 12/13/17 1947  CKTOTAL 2,810*   BNP (last 3 results) No results for input(s): PROBNP in the last 8760 hours. HbA1C: No results for input(s): HGBA1C in the last 72 hours. CBG: No results for input(s): GLUCAP in the last 168 hours. Lipid  Profile: No results for input(s): CHOL, HDL, LDLCALC, TRIG, CHOLHDL, LDLDIRECT in the last 72 hours. Thyroid Function Tests: No results for input(s): TSH, T4TOTAL, FREET4, T3FREE, THYROIDAB in the last 72 hours. Anemia Panel: No results for input(s): VITAMINB12, FOLATE, FERRITIN, TIBC, IRON, RETICCTPCT in the last 72 hours. Sepsis Labs: No results for input(s): PROCALCITON, LATICACIDVEN in the last 168 hours.  No results found for this or any previous visit  (from the past 240 hour(s)).       Radiology Studies: No results found.      Scheduled Meds: . heparin injection (subcutaneous)  5,000 Units Subcutaneous Q8H   Continuous Infusions:   LOS: 0 days        George LloydKshitiz Jerame Hedding, MD [facility] Pager [phone]  If 7PM-7AM, please contact night-coverage [email or web address] Password [email or web address] 12/14/2017, 9:59 AM

## 2017-12-14 NOTE — Care Management (Signed)
Resources for PCP placed on AVS. Sandford Crazeora Cristal Howatt RN,BSN [phone]

## 2017-12-14 NOTE — Consult Note (Signed)
[facility] Face-to-Face Psychiatry Consult   Reason for Consult:  Overdose  Referring Physician:  Dr Starla Link Patient Identification: George Walls MRN:  [id number] Principal Diagnosis: AKI (acute kidney injury) [facility]) Diagnosis:   Patient Active Problem List   Diagnosis Date Noted  . Substance induced mood disorder ([place]) [F19.94] 11/25/2016    Priority: High  . AKI (acute kidney injury) ([place]) [N17.9] 12/13/2017  . Opioid withdrawal ([place]) [F11.23] 12/13/2017  . Rhabdomyolysis [M62.82] 12/13/2017  . Cocaine abuse with cocaine-induced mood disorder ([place]) [F14.14] 01/20/2017  . Hepatitis C [B19.20] 10/23/2016  . Bronchitis, acute [J20.9] 10/23/2016  . Dental caries [K02.9] 10/23/2016  . Polysubstance dependence including opioid type drug with complication, episodic abuse ([place]) [F19.20] 10/21/2016  . Aspiration pneumonia ([place]) [J69.0] 10/21/2016  . Sepsis ([place]) [A41.9] 10/21/2016  . Transaminitis [R74.0] 10/18/2016  . Elevated troponin [R74.8] 10/18/2016  . Accidental drug overdose [T50.901A] 10/17/2016  . Pulmonary edema, noncardiac [J81.1] 10/17/2016  . Acute on chronic respiratory failure with hypoxia (HCC) [J96.21] 10/17/2016    Total Time spent with patient: 45 minutes  Subjective:   George Walls is a 32 y.o. male patient needs psychiatric inpatient hospitalization after medical clearance.  Discontinue Ativan and start gabapentin 300 mg TID for any withdrawal symptoms, may need clonidine opiate withdrawal protocol if positive for any opiate withdrawal symptoms but not Ativan taper.  Patient too somnolent to assess.  HPI:  32 yo male who came to the ED after a heroin overdose, past overdose on Fentanyl in July.  Remains somnolent and difficult to answer any questions.  He was able to communicate he does not know where are what he was doing prior to admission except he was using meth and heroin.  Positive for cocaine on arrival.  No benzodiazepines or alcohol.  Reassess when  alert.  Past Psychiatric History: substance abuse  Risk to Self:  yes Risk to Others:  no Prior Inpatient Therapy:  none Prior Outpatient Therapy:  none  Past Medical History:  Past Medical History:  Diagnosis Date  . Hepatitis C   . IVDU (intravenous drug user)   . Polysubstance dependence including opioid type drug with complication, episodic abuse ([place])    History reviewed. No pertinent surgical history. Family History:  Family History  Problem Relation Age of Onset  . CAD Neg Hx   . Bleeding Disorder Neg Hx    Family Psychiatric  History: none Social History:  Social History   Substance and Sexual Activity  Alcohol Use No     Social History   Substance and Sexual Activity  Drug Use Yes  . Types: Cocaine, Marijuana   Comment: Fentyanl     Social History   Socioeconomic History  . Marital status: Divorced    Spouse name: Not on file  . Number of children: Not on file  . Years of education: Not on file  . Highest education level: Not on file  Occupational History  . Not on file  Social Needs  . Financial resource strain: Not on file  . Food insecurity:    Worry: Not on file    Inability: Not on file  . Transportation needs:    Medical: Not on file    Non-medical: Not on file  Tobacco Use  . Smoking status: Current Every Day Smoker    Packs/day: 2.00    Types: Cigarettes  . Smokeless tobacco: Never Used  Substance and Sexual Activity  . Alcohol use: No  . Drug use: Yes    Types:  Cocaine, Marijuana    Comment: Fentyanl   . Sexual activity: Not on file  Lifestyle  . Physical activity:    Days per week: Not on file    Minutes per session: Not on file  . Stress: Not on file  Relationships  . Social connections:    Talks on phone: Not on file    Gets together: Not on file    Attends religious service: Not on file    Active member of club or organization: Not on file    Attends meetings of clubs or organizations: Not on file    Relationship  status: Not on file  Other Topics Concern  . Not on file  Social History Narrative  . Not on file   Additional Social History:    Allergies:   Allergies  Allergen Reactions  . Sulfa Antibiotics Other (See Comments)    Reaction:  Unknown     Labs:  Results for orders placed or performed during the hospital encounter of 12/13/17 (from the past 48 hour(s))  Comprehensive metabolic panel     Status: Abnormal   Collection Time: 12/13/17  7:47 PM  Result Value Ref Range   Sodium 137 135 - 145 mmol/L   Potassium 3.4 (L) 3.5 - 5.1 mmol/L   Chloride 100 98 - 111 mmol/L   CO2 25 22 - 32 mmol/L   Glucose, Bld 113 (H) 70 - 99 mg/dL   BUN 37 (H) 6 - 20 mg/dL   Creatinine, Ser 1.92 (H) 0.61 - 1.24 mg/dL   Calcium 9.5 8.9 - 10.3 mg/dL   Total Protein 7.5 6.5 - 8.1 g/dL   Albumin 4.2 3.5 - 5.0 g/dL   AST 124 (H) 15 - 41 U/L   ALT 86 (H) 0 - 44 U/L   Alkaline Phosphatase 71 38 - 126 U/L   Total Bilirubin 0.9 0.3 - 1.2 mg/dL   GFR calc non Af Amer 45 (L) >60 mL/min   GFR calc Af Amer 52 (L) >60 mL/min    Comment: (NOTE) The eGFR has been calculated using the CKD EPI equation. This calculation has not been validated in all clinical situations. eGFR's persistently <60 mL/min signify possible Chronic Kidney Disease.    Anion gap 12 5 - 15    Comment: Performed at [facility], [place] [address], [place], [place] [address]  Ethanol     Status: None   Collection Time: 12/13/17  7:47 PM  Result Value Ref Range   Alcohol, Ethyl (B) <10 <10 mg/dL    Comment: (NOTE) Lowest detectable limit for serum alcohol is 10 mg/dL. For medical purposes only. Performed at [facility], [place] [address], [place], [place] [address]   cbc     Status: Abnormal   Collection Time: 12/13/17  7:47 PM  Result Value Ref Range   WBC 8.8 4.0 - 10.5 K/uL   RBC 4.84 4.22 - 5.81 MIL/uL   Hemoglobin 16.3 13.0 - 17.0 g/dL   HCT 45.1 39.0 - 52.0 %   MCV 93.2 78.0 - 100.0 fL    MCH 33.7 26.0 - 34.0 pg   MCHC 36.1 (H) 30.0 - 36.0 g/dL   RDW 11.8 11.5 - 15.5 %   Platelets 208 150 - 400 K/uL    Comment: Performed at [facility], [place] [address] [place], [place] [address]  CK     Status: Abnormal   Collection Time: 12/13/17  7:47 PM  Result Value Ref Range  Total CK 2,810 (H) 49 - 397 U/L    Comment: Performed at [facility], [place] [address], [place], [place] [address]    Current Facility-Administered Medications  Medication Dose Route Frequency Provider Last Rate Last Dose  . heparin injection 5,000 Units  5,000 Units Subcutaneous Q8H Park, Derenda Mis, MD   5,000 Units at 12/14/17 1205  . LORazepam (ATIVAN) injection 0.5 mg  0.5 mg Intravenous Q4H PRN Park, Derenda Mis, MD      . LORazepam (ATIVAN) tablet 2 mg  2 mg Oral Q6H PRN Park, Derenda Mis, MD      . ondansetron [facility]) tablet 4 mg  4 mg Oral Q6H PRN Park, Derenda Mis, MD       Or  . ondansetron [facility]) injection 4 mg  4 mg Intravenous Q6H PRN Park, Derenda Mis, MD        Musculoskeletal: Strength & Muscle Tone: within normal limits Gait & Station: normal Patient leans: N/A  Psychiatric Specialty Exam: Physical Exam  Nursing note and vitals reviewed. Constitutional: He is oriented to person, place, and time. He appears well-developed and well-nourished.  HENT:  Head: Normocephalic.  Neck: Normal range of motion.  Respiratory: Effort normal.  Musculoskeletal: Normal range of motion.  Neurological: He is alert and oriented to person, place, and time.  Psychiatric: His speech is normal and behavior is normal. His mood appears anxious. Cognition and memory are normal. He expresses impulsivity. He exhibits a depressed mood. He expresses suicidal ideation.    Review of Systems  Psychiatric/Behavioral: Positive for depression, substance abuse and suicidal ideas. The patient is nervous/anxious.   All other systems reviewed and are negative.   Blood pressure 96/66,  pulse 60, temperature 98.1 F (36.7 C), temperature source Oral, resp. rate 16, SpO2 98 %.There is no height or weight on file to calculate BMI.  General Appearance: Casual  Eye Contact:  Poor  Speech:  Slow  Volume:  Decreased  Mood:  too drowsy  Affect:  Blunt  Thought Process:  Unable to assess, somnolent  Orientation:  Other:  person  Thought Content:  unable to assess  Suicidal Thoughts:  unable to assess  Homicidal Thoughts:  unable to assess  Memory:  unable to assess  Judgement:  Impaired  Insight:  unable to assess  Psychomotor Activity:  Decreased  Concentration:  Concentration: Poor and Attention Span: Poor  Recall:  Poor  Fund of Knowledge:  unable to assess  Language:  Poor  Akathisia:  No  Handed:  Right  AIMS (if indicated):     Assets:  Others:  unable to assess  ADL's:  Impaired  Cognition:  Impaired,  Moderate  Sleep:        Treatment Plan Summary: Daily contact with patient to assess and evaluate symptoms and progress in treatment, Medication management and Plan substance abuse with substance induced mood disorder:  -Discontinued Ativan  -Started gabapentin 300 mg TID for withdrawal symptoms -Reassess when clear and coherent  Disposition: Supportive therapy provided about ongoing stressors.  Waylan Boga, NP 12/14/2017 2:24 PM

## 2017-12-15 DIAGNOSIS — R945 Abnormal results of liver function studies: Secondary | ICD-10-CM

## 2017-12-15 LAB — CBC WITH DIFFERENTIAL/PLATELET
BASOS ABS: 0 10*3/uL (ref 0.0–0.1)
Basophils Relative: 0 %
EOS PCT: 5 %
Eosinophils Absolute: 0.4 10*3/uL (ref 0.0–0.7)
HCT: 37.9 % — ABNORMAL LOW (ref 39.0–52.0)
Hemoglobin: 13.1 g/dL (ref 13.0–17.0)
LYMPHS ABS: 2 10*3/uL (ref 0.7–4.0)
LYMPHS PCT: 24 %
MCH: 33.1 pg (ref 26.0–34.0)
MCHC: 34.6 g/dL (ref 30.0–36.0)
MCV: 95.7 fL (ref 78.0–100.0)
MONO ABS: 0.5 10*3/uL (ref 0.1–1.0)
MONOS PCT: 6 %
Neutro Abs: 5.3 10*3/uL (ref 1.7–7.7)
Neutrophils Relative %: 65 %
Platelets: 206 10*3/uL (ref 150–400)
RBC: 3.96 MIL/uL — ABNORMAL LOW (ref 4.22–5.81)
RDW: 12.5 % (ref 11.5–15.5)
WBC: 8.3 10*3/uL (ref 4.0–10.5)

## 2017-12-15 LAB — MAGNESIUM: MAGNESIUM: 2 mg/dL (ref 1.7–2.4)

## 2017-12-15 LAB — COMPREHENSIVE METABOLIC PANEL
ALT: 55 U/L — ABNORMAL HIGH (ref 0–44)
AST: 50 U/L — ABNORMAL HIGH (ref 15–41)
Albumin: 2.9 g/dL — ABNORMAL LOW (ref 3.5–5.0)
Alkaline Phosphatase: 47 U/L (ref 38–126)
Anion gap: 9 (ref 5–15)
BILIRUBIN TOTAL: 0.5 mg/dL (ref 0.3–1.2)
BUN: 22 mg/dL — AB (ref 6–20)
CHLORIDE: 109 mmol/L (ref 98–111)
CO2: 21 mmol/L — ABNORMAL LOW (ref 22–32)
Calcium: 8.2 mg/dL — ABNORMAL LOW (ref 8.9–10.3)
Creatinine, Ser: 0.9 mg/dL (ref 0.61–1.24)
Glucose, Bld: 112 mg/dL — ABNORMAL HIGH (ref 70–99)
POTASSIUM: 3.7 mmol/L (ref 3.5–5.1)
Sodium: 139 mmol/L (ref 135–145)
TOTAL PROTEIN: 5.4 g/dL — AB (ref 6.5–8.1)

## 2017-12-15 LAB — CK: CK TOTAL: 363 U/L (ref 49–397)

## 2017-12-15 MED ORDER — LORAZEPAM 2 MG/ML IJ SOLN
1.0000 mg | Freq: Four times a day (QID) | INTRAMUSCULAR | Status: DC | PRN
  Administered 2017-12-15 – 2017-12-16 (×2): 1 mg via INTRAVENOUS
  Filled 2017-12-15 (×2): qty 1

## 2017-12-15 NOTE — Progress Notes (Signed)
Patient ID: George Walls, male   DOB: 10-25-1985, 32 y.o.   MRN: [id number]  PROGRESS NOTE    George Walls  WUJ:[id number] DOB: 10-25-1985 DOA: 12/13/2017 PCP: Patient, No Pcp Per   Brief Narrative:  32 year old male with history of polysubstance abuse, HCV, opiate overdose presented on 12/13/2017 with chief complaint of withdrawal.  He was given Ativan, Librium, Suboxone in the ED.  He was started on intravenous fluids for acute kidney injury and elevated CK.   Assessment & Plan:   Principal Problem:   AKI (acute kidney injury) (HCC) Active Problems:   Opioid withdrawal (HCC)   Rhabdomyolysis   Acute kidney injury and rhabdomyolysis -Probably secondary to dehydration -Improved.  Treated with IV fluids.  Repeat a.m. labs -Encourage oral hydration.  Discontinue IV fluids  Polysubstance abuse with intoxication and withdrawal along with probable depression -Patient states that he uses methamphetamine, heroin and occasional cocaine.  Urine drug screen is is positive for amphetamines, opiates, cocaine and benzodiazepines. -Monitor for withdrawal -Social worker consult -Psychiatry consulted for the same: We will wait for recommendations.  Gabapentin has been started by psychiatry.  Chronic hepatitis C -Outpatient follow-up with PCP  Elevated LFTs -Probably secondary to hepatitis C versus drug abuse.  Improving.  Repeat a.m. labs  DVT prophylaxis: Heparin Code Status: Full Family Communication: None at bedside Disposition Plan: Home in 1 to 2 days if clinically improves  Consultants: Psychiatry  Procedures: None  Antimicrobials: None   Subjective: Patient seen and examined at bedside.  He is sleepy, wakes up slightly but does not answer questions appropriately.  Very slow to respond to questions.  No overnight fever, nausea or vomiting.  Patient had episodes of intermittent agitation yesterday as per the nursing staff.  Objective: Vitals:   12/14/17 0459  12/14/17 1227 12/14/17 2100 12/15/17 0630  BP: 104/68 96/66 121/80 126/84  Pulse: 92 60 71 70  Resp: 17 16  16   Temp: 98.1 F (36.7 C)  97.6 F (36.4 C) 98.4 F (36.9 C)  TempSrc: Oral  Oral Oral  SpO2: 96% 98% 100% 100%    Intake/Output Summary (Last 24 hours) at 12/15/2017 1231 Last data filed at 12/15/2017 0900 Gross per 24 hour  Intake 1745.53 ml  Output 400 ml  Net 1345.53 ml   There were no vitals filed for this visit.  Examination:  General exam: Appears sleepy, wakes up slightly on calling his name, answers very little questions.  Very slow to respond to questions.  Currently calm  respiratory system: Bilateral decreased breath sounds at bases, no wheezing  cardiovascular system: S1 & S2 heard, Rate controlled  Gastrointestinal system: Abdomen is nondistended, soft and nontender. Normal bowel sounds heard. Extremities: No cyanosis, clubbing, edema    Data Reviewed: I have personally reviewed following labs and imaging studies  CBC: Recent Labs  Lab 12/13/17 1947 12/14/17 1320 12/15/17 0341  WBC 8.8 10.3 8.3  NEUTROABS  --  7.9* 5.3  HGB 16.3 14.3 13.1  HCT 45.1 39.8 37.9*  MCV 93.2 92.3 95.7  PLT 208 173 206   Basic Metabolic Panel: Recent Labs  Lab 12/13/17 1947 12/14/17 1320 12/15/17 0341  NA 137 138 139  K 3.4* 4.3 3.7  CL 100 106 109  CO2 25 23 21*  GLUCOSE 113* 91 112*  BUN 37* 31* 22*  CREATININE 1.92* 1.02 0.90  CALCIUM 9.5 8.1* 8.2*  MG  --  1.8 2.0   GFR: CrCl cannot be calculated (Unknown ideal weight.). Liver Function Tests: Recent  Labs  Lab 12/13/17 1947 12/14/17 1320 12/15/17 0341  AST 124* 76* 50*  ALT 86* 67* 55*  ALKPHOS 71 48 47  BILITOT 0.9 0.8 0.5  PROT 7.5 6.0* 5.4*  ALBUMIN 4.2 3.1* 2.9*   No results for input(s): LIPASE, AMYLASE in the last 168 hours. No results for input(s): AMMONIA in the last 168 hours. Coagulation Profile: No results for input(s): INR, PROTIME in the last 168 hours. Cardiac  Enzymes: Recent Labs  Lab 12/13/17 1947 12/14/17 1320 12/15/17 0341  CKTOTAL 2,810* 946* 363   BNP (last 3 results) No results for input(s): PROBNP in the last 8760 hours. HbA1C: No results for input(s): HGBA1C in the last 72 hours. CBG: No results for input(s): GLUCAP in the last 168 hours. Lipid Profile: No results for input(s): CHOL, HDL, LDLCALC, TRIG, CHOLHDL, LDLDIRECT in the last 72 hours. Thyroid Function Tests: No results for input(s): TSH, T4TOTAL, FREET4, T3FREE, THYROIDAB in the last 72 hours. Anemia Panel: No results for input(s): VITAMINB12, FOLATE, FERRITIN, TIBC, IRON, RETICCTPCT in the last 72 hours. Sepsis Labs: No results for input(s): PROCALCITON, LATICACIDVEN in the last 168 hours.  No results found for this or any previous visit (from the past 240 hour(s)).       Radiology Studies: No results found.      Scheduled Meds: . gabapentin  300 mg Oral TID  . heparin injection (subcutaneous)  5,000 Units Subcutaneous Q8H   Continuous Infusions: . sodium chloride 125 mL/hr at 12/15/17 0502     LOS: 0 days        Glade Lloyd, MD [facility] Pager [phone]  If 7PM-7AM, please contact night-coverage [email or web address] Password [email or web address] 12/15/2017, 12:31 PM

## 2017-12-16 DIAGNOSIS — F1490 Cocaine use, unspecified, uncomplicated: Secondary | ICD-10-CM

## 2017-12-16 DIAGNOSIS — F419 Anxiety disorder, unspecified: Secondary | ICD-10-CM

## 2017-12-16 DIAGNOSIS — B182 Chronic viral hepatitis C: Secondary | ICD-10-CM

## 2017-12-16 DIAGNOSIS — F1910 Other psychoactive substance abuse, uncomplicated: Secondary | ICD-10-CM

## 2017-12-16 DIAGNOSIS — F17210 Nicotine dependence, cigarettes, uncomplicated: Secondary | ICD-10-CM

## 2017-12-16 DIAGNOSIS — F1123 Opioid dependence with withdrawal: Secondary | ICD-10-CM

## 2017-12-16 DIAGNOSIS — F1290 Cannabis use, unspecified, uncomplicated: Secondary | ICD-10-CM

## 2017-12-16 LAB — COMPREHENSIVE METABOLIC PANEL
ALT: 46 U/L — AB (ref 0–44)
AST: 36 U/L (ref 15–41)
Albumin: 2.7 g/dL — ABNORMAL LOW (ref 3.5–5.0)
Alkaline Phosphatase: 46 U/L (ref 38–126)
Anion gap: 9 (ref 5–15)
BUN: 13 mg/dL (ref 6–20)
CHLORIDE: 110 mmol/L (ref 98–111)
CO2: 22 mmol/L (ref 22–32)
CREATININE: 0.86 mg/dL (ref 0.61–1.24)
Calcium: 8.4 mg/dL — ABNORMAL LOW (ref 8.9–10.3)
GFR calc Af Amer: >60 mL/min (ref >60)
Glucose, Bld: 85 mg/dL (ref 70–99)
Potassium: 4.2 mmol/L (ref 3.5–5.1)
Sodium: 141 mmol/L (ref 135–145)
Total Bilirubin: 0.3 mg/dL (ref 0.3–1.2)
Total Protein: 5.7 g/dL — ABNORMAL LOW (ref 6.5–8.1)

## 2017-12-16 LAB — MAGNESIUM: MAGNESIUM: 1.9 mg/dL (ref 1.7–2.4)

## 2017-12-16 NOTE — Discharge Summary (Signed)
Physician Discharge Summary  Drinda ButtsChristopher Karasik ZOX:[id number] DOB: 1985/05/01 DOA: 12/13/2017  PCP: Patient, No Pcp Per  Admit date: 12/13/2017 Discharge date: 12/16/2017  Time spent: 45 minutes  Recommendations for Outpatient Follow-up:  Patient will be discharged to home.  Patient will need to follow up with primary care provider within one week of discharge.  Patient should continue medications as prescribed.  Patient should follow a heart healthy diet.   Discharge Diagnoses:  Acute kidney injury with rhabdomyolysis Polysubstance abuse with intoxication and withdrawal Hepatitis C Elevated LFTs   Discharge Condition: Stable  Diet recommendation: heart healthy  There were no vitals filed for this visit.  History of present illness:  On 12/13/2017 by Dr. Koren Boundarolyn Park Drinda ButtsChristopher Bialas is a 32 y.o. male with hx of IV polysubstance abuse, HCV, and previous ED visit for unintentional IV opioid overdose who presented to ED reporting agitation and feeling unwell -- per ED triage note "feeling like sores on his face 'are going to explode.'" Noted to be tachycardic to 100s. When I attempted to interview the patient, he was somnolent and not responding to questions, barely arousable to sternal rub and not awakening to water splashed on his face. [facility] (for fentanyl overdose requiring narcan by EMS en route) and Sep 7 this year (although no documentation detailing the Sep ED visit).   Hospital Course:  Acute kidney injury with rhabdomyolysis -Suspect secondary to dehydration -Acute kidney injury has resolved -CK levels improved from 2810 (on admission) to 363  Polysubstance abuse with intoxication and withdrawal -Admits to using occasional cocaine, heroin and methamphetamines -? Due to depression -Urine drug screen positive for amphetamines, opiates, cocaine and benzodiazepines -No signs or symptoms of withdrawal -Social work consulted -Psychiatry  consulted and appreciated, recommended the use of gabapentin.  Felt patient was not suicidal and could be discharged. -Currently patient does not wish to have any information regarding treatment  Hepatitis C -Chronic, follow-up as an outpatient with PCP  Elevated LFTs  -Possibly secondary to hepatitis C versus drug abuse -LFTs have improved since admission, currently trending downward  Procedures:  none  Consultations:  Psychiatry  Discharge Exam: Vitals:   12/16/17 0419 12/16/17 1334  BP: (!) 138/91 (!) 154/96  Pulse: 70 (!) 55  Resp: 18 16  Temp: 98.4 F (36.9 C) 98 F (36.7 C)  SpO2: 98% 99%   Patient has noted complaints this morning.  Just feeling sleepy and would like to be left alone.  Denies current chest pain, shortness of breath, abdominal pain, nausea vomiting, diarrhea constipation.   General: Well developed, well nourished, NAD, appears stated age  HEENT: NCAT, mucous membranes moist.  Neck: Supple  Cardiovascular: S1 S2 auscultated, RRR, no murmur  Respiratory: Clear to auscultation bilaterally with equal chest rise  Abdomen: Soft, nontender, nondistended, + bowel sounds  Extremities: warm dry without cyanosis clubbing or edema  Neuro: AAOx3, nonfocal  Psych: Flat   Discharge Instructions Discharge Instructions    Discharge instructions   Complete by:  As directed    Patient will be discharged to home.  Patient will need to follow up with primary care provider within one week of discharge.  Patient should continue medications as prescribed.  Patient should follow a heart healthy diet.     Allergies as of 12/16/2017      Reactions   Sulfa Antibiotics Other (See Comments)   Reaction:  Unknown       Medication List    STOP  taking these medications   loperamide 2 MG capsule Commonly known as:  IMODIUM   ondansetron 4 MG disintegrating tablet Commonly known as:  ZOFRAN-ODT     TAKE these medications   cloNIDine 0.1 MG tablet Commonly  known as:  CATAPRES Take 1 tablet (0.1 mg total) by mouth daily. For withdrawal symptoms   gabapentin 300 MG capsule Commonly known as:  NEURONTIN 600mg  by mouth q AM and 900mg  by mouth q PM What changed:    how much to take  how to take this  when to take this   sertraline 100 MG tablet Commonly known as:  ZOLOFT Take 1.5 tablets (150 mg total) by mouth daily. What changed:  Another medication with the same name was removed. Continue taking this medication, and follow the directions you see here.      Allergies  Allergen Reactions  . Sulfa Antibiotics Other (See Comments)    Reaction:  Unknown    Follow-up Information    [facility] Follow up.   Why:  make appointment to establish primary care doctor Contact information: [address] [place] [address] [phone]       [facility] Follow up.   Specialty:  Internal Medicine Why:  If unable to get appointment at the [facility] clinic try this clinic for primary care doctor. Contact information: [address] [place] [address] [address] [id number] mc [facility] [address] [phone]           The results of significant diagnostics from this hospitalization (including imaging, microbiology, ancillary and laboratory) are listed below for reference.    Significant Diagnostic Studies: No results found.  Microbiology: No results found for this or any previous visit (from the past 240 hour(s)).   Labs: Basic Metabolic Panel: Recent Labs  Lab 12/13/17 1947 12/14/17 1320 12/15/17 0341 12/16/17 0350  NA 137 138 139 141  K 3.4* 4.3 3.7 4.2  CL 100 106 109 110  CO2 25 23 21* 22  GLUCOSE 113* 91 112* 85  BUN 37* 31* 22* 13  CREATININE 1.92* 1.02 0.90 0.86  CALCIUM 9.5 8.1* 8.2* 8.4*  MG  --  1.8 2.0 1.9   Liver Function Tests: Recent Labs  Lab 12/13/17 1947 12/14/17 1320 12/15/17 0341 12/16/17 0350  AST  124* 76* 50* 36  ALT 86* 67* 55* 46*  ALKPHOS 71 48 47 46  BILITOT 0.9 0.8 0.5 0.3  PROT 7.5 6.0* 5.4* 5.7*  ALBUMIN 4.2 3.1* 2.9* 2.7*   No results for input(s): LIPASE, AMYLASE in the last 168 hours. No results for input(s): AMMONIA in the last 168 hours. CBC: Recent Labs  Lab 12/13/17 1947 12/14/17 1320 12/15/17 0341  WBC 8.8 10.3 8.3  NEUTROABS  --  7.9* 5.3  HGB 16.3 14.3 13.1  HCT 45.1 39.8 37.9*  MCV 93.2 92.3 95.7  PLT 208 173 206   Cardiac Enzymes: Recent Labs  Lab 12/13/17 1947 12/14/17 1320 12/15/17 0341  CKTOTAL 2,810* 946* 363   BNP: BNP (last 3 results) No results for input(s): BNP in the last 8760 hours.  ProBNP (last 3 results) No results for input(s): PROBNP in the last 8760 hours.  CBG: No results for input(s): GLUCAP in the last 168 hours.     Signed:  Edsel Petrin  [facility] 12/16/2017, 3:29 PM

## 2017-12-16 NOTE — Consult Note (Addendum)
[facility] Psych Consult Progress Note  12/16/2017 2:20 PM George Walls  MRN:  [id number] Subjective:   George Walls was last seen on  9/16 by the psychiatry consult service for heroin overdose. He was too somnolent and confused to participate in interview. Medication recommendations included starting Gabapentin 300 mg TID and discontinuing Ativan.  Per chart review, patient has a history of IV polysubstance abuse. He initially presented to the ED for drug withdrawal versus intoxication with transient nausea and vomiting. He was last seen in the ED for unintentional IV opioid overdose (Fentanyl) which required Narcan in July. He was admitted to [facility] for opioid detox. He was insistent on leaving on the 5th day and this was felt to be due to drug cravings. UDS was positive for amphetamines, cocaine, opiates and benzodiazepines (given Librium prior to UDS collection) and BAL was negative at this admission.    On interview, George Walls reports that his heroin overdose was accidental.  He reports using heroin and methamphetamine daily.  He uses a half a gram of each daily.  He reports substance abuse for the past 3-4 years.  He reports completing rehab in the past.  He last completed rehab in June.  He reports his longest period of sobriety was 6 months due to having a strong support system and attending meetings.  He declines substance abuse resources at this time.  He reports that he cannot maintain a job and also complete rehab.  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  He reports that having a job with concurrent substance abuse treatment is too stressful to maintain sobriety.  He is currently homeless but plans to return to the [facility].  He was told that if he maintains his sobriety for 2 weeks that he can return.  He has another week prior to returning to the [facility].  He plans to live with his sister in the interim.  He reports that he is  followed by [facility].  He would like to see a therapist although he has not set this up yet.  He reports that his mood is "up and down."  He reports compliance with Zoloft and Gabapentin.  He denies SI, HI or AVH.  He denies a history of prior suicide attempts.  He denies problems with sleep or appetite.  Principal Problem: Polysubstance abuse ([place]) Diagnosis:   Patient Active Problem List   Diagnosis Date Noted  . AKI (acute kidney injury) ([place]) [N17.9] 12/13/2017  . Opioid withdrawal ([place]) [F11.23] 12/13/2017  . Rhabdomyolysis [M62.82] 12/13/2017  . Cocaine abuse with cocaine-induced mood disorder ([place]) [F14.14] 01/20/2017  . Substance induced mood disorder ([place]) [F19.94] 11/25/2016  . Hepatitis C [B19.20] 10/23/2016  . Bronchitis, acute [J20.9] 10/23/2016  . Dental caries [K02.9] 10/23/2016  . Polysubstance dependence including opioid type drug with complication, episodic abuse ([place]) [F19.20] 10/21/2016  . Aspiration pneumonia ([place]) [J69.0] 10/21/2016  . Sepsis ([place]) [A41.9] 10/21/2016  . Transaminitis [R74.0] 10/18/2016  . Elevated troponin [R74.8] 10/18/2016  . Accidental drug overdose [T50.901A] 10/17/2016  . Pulmonary edema, noncardiac [J81.1] 10/17/2016  . Acute on chronic respiratory failure with hypoxia [facility]) [J96.21] 10/17/2016   Total Time spent with patient: 15 minutes  Past Psychiatric History: MDD and polysubstance abuse (cannabis abuse, opioid abuse and methamphetamine abuse).   Past Medical History:  Past Medical History:  Diagnosis Date  . Hepatitis C   . IVDU (intravenous drug user)   . Polysubstance dependence including  opioid type drug with complication, episodic abuse ([place])    History reviewed. No pertinent surgical history. Family History:  Family History  Problem Relation Age of Onset  . CAD Neg Hx   . Bleeding Disorder Neg Hx    Family Psychiatric  History: Mother-depression, father-depression and substance abuse, sister-depression and maternal and  paternal grandparents with substance abuse.   Social History:  Social History   Substance and Sexual Activity  Alcohol Use No     Social History   Substance and Sexual Activity  Drug Use Yes  . Types: Cocaine, Marijuana   Comment: Fentyanl     Social History   Socioeconomic History  . Marital status: Divorced    Spouse name: Not on file  . Number of children: Not on file  . Years of education: Not on file  . Highest education level: Not on file  Occupational History  . Not on file  Social Needs  . Financial resource strain: Not on file  . Food insecurity:    Worry: Not on file    Inability: Not on file  . Transportation needs:    Medical: Not on file    Non-medical: Not on file  Tobacco Use  . Smoking status: Current Every Day Smoker    Packs/day: 2.00    Types: Cigarettes  . Smokeless tobacco: Never Used  Substance and Sexual Activity  . Alcohol use: No  . Drug use: Yes    Types: Cocaine, Marijuana    Comment: Fentyanl   . Sexual activity: Not on file  Lifestyle  . Physical activity:    Days per week: Not on file    Minutes per session: Not on file  . Stress: Not on file  Relationships  . Social connections:    Talks on phone: Not on file    Gets together: Not on file    Attends religious service: Not on file    Active member of club or organization: Not on file    Attends meetings of clubs or organizations: Not on file    Relationship status: Not on file  Other Topics Concern  . Not on file  Social History Narrative  . Not on file    Sleep: Good  Appetite:  Good  Current Medications: Current Facility-Administered Medications  Medication Dose Route Frequency Provider Last Rate Last Dose  . gabapentin (NEURONTIN) capsule 300 mg  300 mg Oral TID Patrecia Pour, NP   300 mg at 12/16/17 1056  . haloperidol lactate (HALDOL) injection 2 mg  2 mg Intravenous Q6H PRN Aline August, MD      . heparin injection 5,000 Units  5,000 Units Subcutaneous Q8H  Park, Derenda Mis, MD   5,000 Units at 12/14/17 2100  . LORazepam (ATIVAN) injection 1 mg  1 mg Intravenous Q6H PRN Aline August, MD   1 mg at 12/16/17 1254  . ondansetron (ZOFRAN) tablet 4 mg  4 mg Oral Q6H PRN Park, Derenda Mis, MD       Or  . ondansetron [facility]) injection 4 mg  4 mg Intravenous Q6H PRN Park, Derenda Mis, MD        Lab Results:  Results for orders placed or performed during the hospital encounter of 12/13/17 (from the past 48 hour(s))  Urine rapid drug screen (hosp performed)     Status: Abnormal   Collection Time: 12/14/17  9:24 PM  Result Value Ref Range   Opiates POSITIVE (A) NONE DETECTED   Cocaine POSITIVE (A)  NONE DETECTED   Benzodiazepines POSITIVE (A) NONE DETECTED   Amphetamines POSITIVE (A) NONE DETECTED   Tetrahydrocannabinol NONE DETECTED NONE DETECTED   Barbiturates NONE DETECTED NONE DETECTED    Comment: (NOTE) DRUG SCREEN FOR MEDICAL PURPOSES ONLY.  IF CONFIRMATION IS NEEDED FOR ANY PURPOSE, NOTIFY LAB WITHIN 5 DAYS. LOWEST DETECTABLE LIMITS FOR URINE DRUG SCREEN Drug Class                     Cutoff (ng/mL) Amphetamine and metabolites    1000 Barbiturate and metabolites    200 Benzodiazepine                 119 Tricyclics and metabolites     300 Opiates and metabolites        300 Cocaine and metabolites        300 THC                            50 Performed at [facility], [place] [address], [place], [place] [address]   CBC with Differential/Platelet     Status: Abnormal   Collection Time: 12/15/17  3:41 AM  Result Value Ref Range   WBC 8.3 4.0 - 10.5 K/uL   RBC 3.96 (L) 4.22 - 5.81 MIL/uL   Hemoglobin 13.1 13.0 - 17.0 g/dL   HCT 37.9 (L) 39.0 - 52.0 %   MCV 95.7 78.0 - 100.0 fL   MCH 33.1 26.0 - 34.0 pg   MCHC 34.6 30.0 - 36.0 g/dL   RDW 12.5 11.5 - 15.5 %   Platelets 206 150 - 400 K/uL   Neutrophils Relative % 65 %   Neutro Abs 5.3 1.7 - 7.7 K/uL   Lymphocytes Relative 24 %   Lymphs Abs 2.0 0.7 - 4.0 K/uL    Monocytes Relative 6 %   Monocytes Absolute 0.5 0.1 - 1.0 K/uL   Eosinophils Relative 5 %   Eosinophils Absolute 0.4 0.0 - 0.7 K/uL   Basophils Relative 0 %   Basophils Absolute 0.0 0.0 - 0.1 K/uL    Comment: Performed at [facility], [place] [address], [place], [place] [address]  Comprehensive metabolic panel     Status: Abnormal   Collection Time: 12/15/17  3:41 AM  Result Value Ref Range   Sodium 139 135 - 145 mmol/L   Potassium 3.7 3.5 - 5.1 mmol/L   Chloride 109 98 - 111 mmol/L   CO2 21 (L) 22 - 32 mmol/L   Glucose, Bld 112 (H) 70 - 99 mg/dL   BUN 22 (H) 6 - 20 mg/dL   Creatinine, Ser 0.90 0.61 - 1.24 mg/dL   Calcium 8.2 (L) 8.9 - 10.3 mg/dL   Total Protein 5.4 (L) 6.5 - 8.1 g/dL   Albumin 2.9 (L) 3.5 - 5.0 g/dL   AST 50 (H) 15 - 41 U/L   ALT 55 (H) 0 - 44 U/L   Alkaline Phosphatase 47 38 - 126 U/L   Total Bilirubin 0.5 0.3 - 1.2 mg/dL   GFR calc non Af Amer >60 >60 mL/min   GFR calc Af Amer >60 >60 mL/min    Comment: (NOTE) The eGFR has been calculated using the CKD EPI equation. This calculation has not been validated in all clinical situations. eGFR's persistently <60 mL/min signify possible Chronic Kidney Disease.    Anion gap 9 5 - 15    Comment: Performed at [facility], [place] [address], [place], [place] [address]  Magnesium     Status: None   Collection Time: 12/15/17  3:41 AM  Result Value Ref Range   Magnesium 2.0 1.7 - 2.4 mg/dL    Comment: Performed at [facility], [place] [address] [place], [place] [address]  CK     Status: None   Collection Time: 12/15/17  3:41 AM  Result Value Ref Range   Total CK 363 49 - 397 U/L    Comment: Performed at [facility], [place] [address], [place], [place] [address]  Comprehensive metabolic panel     Status: Abnormal   Collection Time: 12/16/17  3:50 AM  Result Value Ref Range   Sodium 141 135 - 145 mmol/L   Potassium 4.2 3.5 - 5.1 mmol/L   Chloride  110 98 - 111 mmol/L   CO2 22 22 - 32 mmol/L   Glucose, Bld 85 70 - 99 mg/dL   BUN 13 6 - 20 mg/dL   Creatinine, Ser 0.86 0.61 - 1.24 mg/dL   Calcium 8.4 (L) 8.9 - 10.3 mg/dL   Total Protein 5.7 (L) 6.5 - 8.1 g/dL   Albumin 2.7 (L) 3.5 - 5.0 g/dL   AST 36 15 - 41 U/L   ALT 46 (H) 0 - 44 U/L   Alkaline Phosphatase 46 38 - 126 U/L   Total Bilirubin 0.3 0.3 - 1.2 mg/dL   GFR calc non Af Amer >60 >60 mL/min   GFR calc Af Amer >60 >60 mL/min    Comment: (NOTE) The eGFR has been calculated using the CKD EPI equation. This calculation has not been validated in all clinical situations. eGFR's persistently <60 mL/min signify possible Chronic Kidney Disease.    Anion gap 9 5 - 15    Comment: Performed at [facility], [place] [address], [place], [place] [address]  Magnesium     Status: None   Collection Time: 12/16/17  3:50 AM  Result Value Ref Range   Magnesium 1.9 1.7 - 2.4 mg/dL    Comment: Performed at [facility], [place] [address] [place], [place] [address]    Musculoskeletal: Strength & Muscle Tone: within normal limits Gait & Station: UTA since patient is lying in bed. Patient leans: N/A  Psychiatric Specialty Exam: Physical Exam  Nursing note and vitals reviewed. Constitutional: He is oriented to person, place, and time. He appears well-developed and well-nourished.  HENT:  Head: Normocephalic and atraumatic.  Neck: Normal range of motion.  Respiratory: Effort normal.  Musculoskeletal: Normal range of motion.  Neurological: He is alert and oriented to person, place, and time.  Psychiatric: He has a normal mood and affect. His speech is normal and behavior is normal. Judgment and thought content normal. Cognition and memory are normal.    Review of Systems  Constitutional: Negative for chills and fever.  Cardiovascular: Negative for chest pain.  Gastrointestinal: Positive for nausea. Negative for abdominal pain, constipation, diarrhea  and vomiting.  Psychiatric/Behavioral: Positive for depression and substance abuse. Negative for hallucinations and suicidal ideas. The patient does not have insomnia.   All other systems reviewed and are negative.   Blood pressure (!) 154/96, pulse (!) 55, temperature 98 F (36.7 C), temperature source Oral, resp. rate 16, SpO2 99 %.There is no height or weight on file to calculate BMI.  General Appearance: Fairly Groomed, young, Caucasian male, wearing a hospital gown with skin excoriations on his face, arms and chest, unshaved  face and poor dentition who is lying in bed.   Eye Contact:  Good  Speech:  Clear and Coherent and Normal Rate  Volume:  Normal  Mood:  "My mood is up and down."  Affect:  Constricted  Thought Process:  Goal Directed, Linear and Descriptions of Associations: Intact  Orientation:  Full (Time, Place, and Person)  Thought Content:  Logical  Suicidal Thoughts:  No  Homicidal Thoughts:  No  Memory:  Immediate;   Good Recent;   Good Remote;   Good  Judgement:  Fair  Insight:  Fair  Psychomotor Activity:  Normal  Concentration:  Concentration: Good and Attention Span: Good  Recall:  Good  Fund of Knowledge:  Good  Language:  Good  Akathisia:  No  Handed:  Right  AIMS (if indicated):   N/A  Assets:  Communication Skills Desire for Improvement Resilience  ADL's:  Intact  Cognition:  WNL  Sleep:   Okay   Assessment:  Ellie Spickler is a a 32 y.o. male who was admitted with heroin overdose and subsequent withdrawal. He denies an intentional overdose. He clearly has problematic use but declines resources for substance abuse treatment. He denies SI, HI or AVH. He does not warrant inpatient psychiatric hospitalization at this time.    Treatment Plan Summary: -Continue Zoloft 150 mg daily for depression and anxiety.  -Continue Gabapentin 300 mg TID for substance cravings and anxiety. -Follow up with outpatient provider at [facility].  -Patient offered  substance abuse resources but he declined them.  -Psychiatry will sign off on patient at this time. Please consult psychiatry again as needed.   Faythe Dingwall, DO 12/16/2017, 2:20 PM

## 2017-12-19 ENCOUNTER — Other Ambulatory Visit: Payer: Self-pay

## 2017-12-19 ENCOUNTER — Emergency Department (HOSPITAL_COMMUNITY)
Admission: EM | Admit: 2017-12-19 | Discharge: 2017-12-20 | Disposition: A | Discharge status: Home / Self Care | Payer: Self-pay | Attending: Emergency Medicine | Admitting: Emergency Medicine

## 2017-12-19 ENCOUNTER — Encounter (HOSPITAL_COMMUNITY): Payer: Self-pay | Admitting: Emergency Medicine

## 2017-12-19 DIAGNOSIS — F1920 Other psychoactive substance dependence, uncomplicated: Secondary | ICD-10-CM | POA: Insufficient documentation

## 2017-12-19 DIAGNOSIS — F1410 Cocaine abuse, uncomplicated: Secondary | ICD-10-CM | POA: Insufficient documentation

## 2017-12-19 DIAGNOSIS — F322 Major depressive disorder, single episode, severe without psychotic features: Secondary | ICD-10-CM | POA: Insufficient documentation

## 2017-12-19 DIAGNOSIS — F1210 Cannabis abuse, uncomplicated: Secondary | ICD-10-CM | POA: Insufficient documentation

## 2017-12-19 DIAGNOSIS — R45851 Suicidal ideations: Secondary | ICD-10-CM | POA: Insufficient documentation

## 2017-12-19 DIAGNOSIS — F17210 Nicotine dependence, cigarettes, uncomplicated: Secondary | ICD-10-CM | POA: Insufficient documentation

## 2017-12-19 HISTORY — DX: Suicidal ideations: R45.851

## 2017-12-19 LAB — CBC
HCT: 44 % (ref 39.0–52.0)
HEMOGLOBIN: 15.2 g/dL (ref 13.0–17.0)
MCH: 32.7 pg (ref 26.0–34.0)
MCHC: 34.5 g/dL (ref 30.0–36.0)
MCV: 94.6 fL (ref 78.0–100.0)
Platelets: 302 10*3/uL (ref 150–400)
RBC: 4.65 MIL/uL (ref 4.22–5.81)
RDW: 11.8 % (ref 11.5–15.5)
WBC: 9.7 10*3/uL (ref 4.0–10.5)

## 2017-12-19 LAB — ACETAMINOPHEN LEVEL: Acetaminophen (Tylenol), Serum: <10 ug/mL — ABNORMAL LOW (ref 10–30)

## 2017-12-19 LAB — COMPREHENSIVE METABOLIC PANEL
ALK PHOS: 50 U/L (ref 38–126)
ALT: 45 U/L — ABNORMAL HIGH (ref 0–44)
AST: 30 U/L (ref 15–41)
Albumin: 3.7 g/dL (ref 3.5–5.0)
Anion gap: 16 — ABNORMAL HIGH (ref 5–15)
BUN: 9 mg/dL (ref 6–20)
CALCIUM: 8.7 mg/dL — AB (ref 8.9–10.3)
CO2: 18 mmol/L — ABNORMAL LOW (ref 22–32)
CREATININE: 0.95 mg/dL (ref 0.61–1.24)
Chloride: 107 mmol/L (ref 98–111)
Glucose, Bld: 98 mg/dL (ref 70–99)
Potassium: 3.3 mmol/L — ABNORMAL LOW (ref 3.5–5.1)
Sodium: 141 mmol/L (ref 135–145)
Total Bilirubin: 1.1 mg/dL (ref 0.3–1.2)
Total Protein: 6.8 g/dL (ref 6.5–8.1)

## 2017-12-19 LAB — RAPID URINE DRUG SCREEN, HOSP PERFORMED
Amphetamines: NOT DETECTED
BARBITURATES: NOT DETECTED
Benzodiazepines: POSITIVE — AB
Cocaine: NOT DETECTED
Opiates: NOT DETECTED
TETRAHYDROCANNABINOL: NOT DETECTED

## 2017-12-19 LAB — SALICYLATE LEVEL

## 2017-12-19 LAB — ETHANOL: Alcohol, Ethyl (B): <10 mg/dL (ref <10)

## 2017-12-19 MED ORDER — NICOTINE 21 MG/24HR TD PT24
21.0000 mg | MEDICATED_PATCH | Freq: Every day | TRANSDERMAL | Status: DC
  Administered 2017-12-19 – 2017-12-20 (×2): 21 mg via TRANSDERMAL
  Filled 2017-12-19 (×2): qty 1

## 2017-12-19 MED ORDER — DIPHENHYDRAMINE HCL 25 MG PO CAPS
25.0000 mg | ORAL_CAPSULE | Freq: Once | ORAL | Status: AC
  Administered 2017-12-19: 25 mg via ORAL
  Filled 2017-12-19: qty 1

## 2017-12-19 MED ORDER — SERTRALINE HCL 50 MG PO TABS
150.0000 mg | ORAL_TABLET | Freq: Every day | ORAL | Status: DC
  Administered 2017-12-19 – 2017-12-20 (×2): 150 mg via ORAL
  Filled 2017-12-19 (×2): qty 1

## 2017-12-19 MED ORDER — POTASSIUM CHLORIDE CRYS ER 20 MEQ PO TBCR
40.0000 meq | EXTENDED_RELEASE_TABLET | Freq: Once | ORAL | Status: AC
  Administered 2017-12-19: 40 meq via ORAL
  Filled 2017-12-19: qty 2

## 2017-12-19 MED ORDER — HYDROXYZINE HCL 25 MG PO TABS
25.0000 mg | ORAL_TABLET | Freq: Three times a day (TID) | ORAL | Status: DC | PRN
  Administered 2017-12-19 – 2017-12-20 (×3): 25 mg via ORAL
  Filled 2017-12-19 (×3): qty 1

## 2017-12-19 NOTE — Progress Notes (Signed)
Per RN, George Walls, patient is not awake. He had difficulty responding when she just went in and has been awake all night. TTS will attempt to re-assess in a couple hours.

## 2017-12-19 NOTE — ED Notes (Signed)
Pt aware no Benzos or Neurontin to be given. Voiced understanding.

## 2017-12-19 NOTE — ED Notes (Signed)
Pt requesting Zoloft and Neurontin. States has been off meds x 1 week. Also requesting med for anxiety. Per Denice BorsShuvon, NP, [facility] - recommends restarting Zoloft - hold Neurontin. Add Vistaril 25mg  po TID prn. Recommended no Benzos.

## 2017-12-19 NOTE — ED Notes (Signed)
Pt's belongings bagged and placed in locker by Luisa HartPatrick, [place].  Security called to wand pt.  Staffing aware of BH sitter need.

## 2017-12-19 NOTE — ED Notes (Signed)
Pt noted w/removable splint to left wrist/hand. Pt able to wiggle fingers w/o difficulty. Cap refill less than 3 sec.

## 2017-12-19 NOTE — ED Triage Notes (Signed)
Pt to triage via [facility]>  Reports he is suicidal with a plan to overdose on pills.  States he has been off Zoloft x 1 week.

## 2017-12-19 NOTE — ED Notes (Signed)
Pt's belongings in locker 4

## 2017-12-19 NOTE — BH Assessment (Signed)
Tele Assessment Note   Patient Name: George Walls MRN: [id number] Referring Physician: Hector Shade Location of Patient: [facility] ED Location of Provider: Behavioral Health TTS Department  George Walls is an 32 y.o. male presenting voluntarily to [facility] ED complaining of suicidal ideation. Patient stated that he was been feeling suicidal for 5 days since he ran out of his Zoloft. Patient endorsed current suicidal ideation without a plan during assessment. Patient reported to triage nurse he planned to overdose. Patient denied HI and AVH. Patient presented guarded during assessment and was not forthcoming with information regarding his substance use. According to records, patient has had multiple hospitalizations for drug use, including a recent overdose on Fentanyl in July. Patient accessed ED again on 12/09/2017 for a heroin overdose. Patient is currently receiving medication management from [facility] but states he is out of medications. Patient reported being diagnosed with depression for 10 years and has consistently been taking medications until the past 5 days. Patient reported being hospitalized 5 years ago at [facility] for depression. Patient denied history of abuse and criminal charges. Patient denied family mental health history.  Patient was awake but guarded during assessment. He was reluctant to give information regarding his substance use but stated that he was suicidal. His affect was depressed and irritable. His mood was congruent. He was oriented x 4. Judgement and impulse control are poor and his insight is limited.   Per [name], NP patient to be held for observation and re-assessed.  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 Hepatitis C   . IVDU (intravenous drug user)   . Polysubstance dependence including opioid type drug with complication, episodic abuse (HCC)   . Suicidal ideation     History reviewed. No  pertinent surgical history.  Family History:  Family History  Problem Relation Age of Onset  . CAD Neg Hx   . Bleeding Disorder Neg Hx     Social History:  reports that he has been smoking cigarettes. He has been smoking about 2.00 packs per day. He has never used smokeless tobacco. He reports that he has current or past drug history. Drugs: Cocaine, Marijuana, and IV. He reports that he does not drink alcohol.  Additional Social History:  Alcohol / Drug Use Pain Medications: see MAR Prescriptions: see MAR Over the Counter: see MAR History of alcohol / drug use?: Yes Longest period of sobriety (when/how long): 3 months Substance #1 Name of Substance 1: Heroin 1 - Age of First Use: UTA 1 - Amount (size/oz): UTA 1 - Frequency: UTA 1 - Duration: "years" 1 - Last Use / Amount: 2 weeks ago  CIWA: CIWA-Ar BP: (!) 149/79 Pulse Rate: 96 COWS:    Allergies:  Allergies  Allergen Reactions  . Sulfa Antibiotics Other (See Comments)    Reaction:  Unknown     Home Medications:  (Not in a hospital admission)  OB/GYN Status:  No LMP for male patient.  General Assessment Data Assessment unable to be completed: Yes Reason for not completing assessment: (pt not responsive) Location of Assessment: [facility] ED TTS Assessment: In system Is this a Tele or Face-to-Face Assessment?: Tele Assessment Is this an Initial Assessment or a Re-assessment for this encounter?: Initial Assessment Patient Accompanied by:: Other(self) Language Other than English: No Living Arrangements: Other (Comment)(home) What gender do you identify as?: Male Marital status: Single Maiden name: n/a Pregnancy Status: No Living Arrangements: Other (Comment) Can pt return to current  living arrangement?: Yes Admission Status: Voluntary Is patient capable of signing voluntary admission?: Yes Referral Source: Self/Family/Friend Insurance type: Education officer, environmental)     Crisis Care Plan Living Arrangements: Other  (Comment)     Risk to self with the past 6 months Suicidal Ideation: Yes-Currently Present Has patient been a risk to self within the past 6 months prior to admission? : Yes Suicidal Intent: No-Not Currently/Within Last 6 Months Has patient had any suicidal intent within the past 6 months prior to admission? : Yes Is patient at risk for suicide?: Yes Suicidal Plan?: No-Not Currently/Within Last 6 Months(yesterday stated he wanted to overdose) Has patient had any suicidal plan within the past 6 months prior to admission? : Yes Access to Means: Yes Specify Access to Suicidal Means: prescriptions/ illicit drugs What has been your use of drugs/alcohol within the last 12 months?: (patient denies- but according to records polysub use) Previous Attempts/Gestures: No How many times?: 0 Other Self Harm Risks: drug use Triggers for Past Attempts: (n/a) Intentional Self Injurious Behavior: None Family Suicide History: No Recent stressful life event(s): (substance use) Persecutory voices/beliefs?: No Depression: Yes Depression Symptoms: Feeling angry/irritable, Feeling worthless/self pity, Loss of interest in usual pleasures Substance abuse history and/or treatment for substance abuse?: Yes Suicide prevention information given to non-admitted patients: Not applicable  Risk to Others within the past 6 months Homicidal Ideation: No Does patient have any lifetime risk of violence toward others beyond the six months prior to admission? : No Thoughts of Harm to Others: No Current Homicidal Intent: No Current Homicidal Plan: No Access to Homicidal Means: No Identified Victim: n/a History of harm to others?: No Assessment of Violence: None Noted Violent Behavior Description: n/a Does patient have access to weapons?: No Criminal Charges Pending?: No Does patient have a court date: No Is patient on probation?: No  Psychosis Hallucinations: None noted Delusions: None noted  Mental Status  Report Appearance/Hygiene: In scrubs Eye Contact: Poor Motor Activity: Freedom of movement Speech: Logical/coherent Level of Consciousness: Alert Mood: Depressed, Irritable Affect: Anxious, Depressed, Irritable Anxiety Level: Moderate Thought Processes: Coherent Judgement: Impaired Orientation: Person, Place, Time, Situation Obsessive Compulsive Thoughts/Behaviors: None  Cognitive Functioning Concentration: Normal Memory: Recent Intact Is patient IDD: No Insight: Poor Impulse Control: Poor Appetite: Poor Have you had any weight changes? : No Change Sleep: Decreased Total Hours of Sleep: (unknown) Vegetative Symptoms: None  ADLScreening [facility] Assessment Services) Patient's cognitive ability adequate to safely complete daily activities?: Yes Patient able to express need for assistance with ADLs?: Yes Independently performs ADLs?: Yes (appropriate for developmental age)  Prior Inpatient Therapy Prior Inpatient Therapy: Yes Reason for Treatment: depression  Prior Outpatient Therapy Prior Outpatient Therapy: Yes Prior Therapy Dates: (patient unsure) Prior Therapy Facilty/Provider(s): [facility] Reason for Treatment: depression Does patient have an ACCT team?: No Does patient have Intensive In-House Services?  : No Does patient have [facility] services? : Yes Does patient have P4CC services?: No  ADL Screening (condition at time of admission) Patient's cognitive ability adequate to safely complete daily activities?: Yes Is the patient deaf or have difficulty hearing?: No Does the patient have difficulty seeing, even when wearing glasses/contacts?: No Does the patient have difficulty concentrating, remembering, or making decisions?: No Patient able to express need for assistance with ADLs?: Yes Does the patient have difficulty dressing or bathing?: No Independently performs ADLs?: Yes (appropriate for developmental age) Does the patient have difficulty walking or climbing  stairs?: No Weakness of Legs: None Weakness of Arms/Hands: None  Therapy Consults (therapy consults require a physician order) PT Evaluation Needed: No OT Evalulation Needed: No SLP Evaluation Needed: No Abuse/Neglect Assessment (Assessment to be complete while patient is alone) Abuse/Neglect Assessment Can Be Completed: Yes Physical Abuse: Denies Verbal Abuse: Denies Sexual Abuse: Denies Exploitation of patient/patient's resources: Denies Self-Neglect: Denies Values / Beliefs Cultural Requests During Hospitalization: None Spiritual Requests During Hospitalization: None Consults Spiritual Care Consult Needed: No Social Work Consult Needed: No Merchant navy officerAdvance Directives (For Healthcare) Does Patient Have a Medical Advance Directive?: No Would patient like information on creating a medical advance directive?: No - Patient declined          Disposition: Per Assunta FoundShuvon Rankin, NP patient to be held for observation and re-assessed. Disposition Initial Assessment Completed for this Encounter: Yes  This service was provided via telemedicine using a 2-way, interactive audio and video technology.  Names of all persons participating in this telemedicine service and their role in this encounter. Name: Celedonio MiyamotoMeredith Malvin Morrish, [place] Role: TTS  Name: Assunta FoundShuvon Rankin, NP Role: EDP  Name:  Role:   Name: Role:     Celedonio MiyamotoMeredith  Savannah Erbe 12/19/2017 10:37 AM

## 2017-12-19 NOTE — ED Provider Notes (Signed)
[facility] EMERGENCY DEPARTMENT Provider Note   CSN: [id number] Arrival date & time: 12/19/17  0211     History   Chief Complaint Chief Complaint  Patient presents with  . Suicidal    HPI George Walls is a 32 y.o. male.  Patient to ED with SI, planning to overdose on medication. No self harm prior to arrival. He reports ongoing substance abuse, last used anything one week ago. . No HI/AVH. No physical complaints.  The history is provided by the patient. No language interpreter was used.    Past Medical History:  Diagnosis Date  . Hepatitis C   . IVDU (intravenous drug user)   . Polysubstance dependence including opioid type drug with complication, episodic abuse (HCC)   . Suicidal ideation     Patient Active Problem List   Diagnosis Date Noted  . Polysubstance abuse (HCC)   . AKI (acute kidney injury) (HCC) 12/13/2017  . Opioid withdrawal (HCC) 12/13/2017  . Rhabdomyolysis 12/13/2017  . Cocaine abuse with cocaine-induced mood disorder (HCC) 01/20/2017  . Substance induced mood disorder (HCC) 11/25/2016  . Hepatitis C 10/23/2016  . Bronchitis, acute 10/23/2016  . Dental caries 10/23/2016  . Polysubstance dependence including opioid type drug with complication, episodic abuse (HCC) 10/21/2016  . Aspiration pneumonia (HCC) 10/21/2016  . Sepsis (HCC) 10/21/2016  . Transaminitis 10/18/2016  . Elevated troponin 10/18/2016  . Accidental drug overdose 10/17/2016  . Pulmonary edema, noncardiac 10/17/2016  . Acute on chronic respiratory failure with hypoxia (HCC) 10/17/2016    History reviewed. No pertinent surgical history.      Home Medications    Prior to Admission medications   Medication Sig Start Date End Date Taking? Authorizing Provider  cloNIDine (CATAPRES) 0.1 MG tablet Take 1 tablet (0.1 mg total) by mouth daily. For withdrawal symptoms 11/23/16 12/20/26 Yes Shaune Pollack, MD  gabapentin (NEURONTIN) 300 MG capsule 600mg  by  mouth q AM and 900mg  by mouth q PM Patient taking differently: Take 600-900 mg by mouth See admin instructions. Take 2 capsules by mouth every morning and take 3 capsules every evening 09/17/17  Yes Eber Hong, MD  sertraline (ZOLOFT) 100 MG tablet Take 1.5 tablets (150 mg total) by mouth daily. 09/17/17  Yes Eber Hong, MD    Family History Family History  Problem Relation Age of Onset  . CAD Neg Hx   . Bleeding Disorder Neg Hx     Social History Social History   Tobacco Use  . Smoking status: Current Every Day Smoker    Packs/day: 2.00    Types: Cigarettes  . Smokeless tobacco: Never Used  Substance Use Topics  . Alcohol use: No  . Drug use: Yes    Types: Cocaine, Marijuana, IV    Comment: Fentyanl, heroin     Allergies   Sulfa antibiotics   Review of Systems Review of Systems  Constitutional: Negative for chills and fever.  HENT: Negative.   Respiratory: Negative.   Cardiovascular: Negative.   Gastrointestinal: Negative.   Musculoskeletal: Negative.   Skin: Negative.   Neurological: Negative.   Psychiatric/Behavioral: Positive for suicidal ideas. Negative for hallucinations and self-injury.     Physical Exam Updated Vital Signs BP (!) 149/79 (BP Location: Right Arm)   Pulse 96   Temp 98.6 F (37 C) (Oral)   Resp 14   SpO2 100%   Physical Exam  Constitutional: He is oriented to person, place, and time. He appears well-developed and well-nourished.  HENT:  Head: Normocephalic.  Neck: Normal range of motion. Neck supple.  Cardiovascular: Normal rate and regular rhythm.  Pulmonary/Chest: Effort normal and breath sounds normal. He has no wheezes. He has no rales.  Abdominal: Soft. Bowel sounds are normal. There is no tenderness. There is no rebound and no guarding.  Musculoskeletal: Normal range of motion.  Neurological: He is alert and oriented to person, place, and time.  Skin: Skin is warm and dry. No rash noted.  Psychiatric: He has a normal  mood and affect. His speech is normal and behavior is normal. He is not actively hallucinating. He expresses suicidal ideation. He expresses suicidal plans.     ED Treatments / Results  Labs (all labs ordered are listed, but only abnormal results are displayed) Labs Reviewed  COMPREHENSIVE METABOLIC PANEL - Abnormal; Notable for the following components:      Result Value   Potassium 3.3 (*)    CO2 18 (*)    Calcium 8.7 (*)    ALT 45 (*)    Anion gap 16 (*)    All other components within normal limits  ACETAMINOPHEN LEVEL - Abnormal; Notable for the following components:   Acetaminophen (Tylenol), Serum <10 (*)    All other components within normal limits  RAPID URINE DRUG SCREEN, HOSP PERFORMED - Abnormal; Notable for the following components:   Benzodiazepines POSITIVE (*)    All other components within normal limits  ETHANOL  SALICYLATE LEVEL  CBC    EKG None  Radiology No results found.  Procedures Procedures (including critical care time)  Medications Ordered in ED Medications - No data to display   Initial Impression / Assessment and Plan / ED Course  I have reviewed the triage vital signs and the nursing notes.  Pertinent labs & imaging results that were available during my care of the patient were reviewed by me and considered in my medical decision making (see chart for details).     Patient is in ED with SI, plan to overdose on medication. History of suicide attempt.   He is considered medically cleared for TTS evaluation, requested to determine disposition.   Mild hypokalemia addressed with supplementation.   Final Clinical Impressions(s) / ED Diagnoses   Final diagnoses:  None   1. Suicidal ideation  ED Discharge Orders    None       Elpidio AnisUpstill, Dierdre Mccalip, PA-C 12/19/17 0548    Gilda CreasePollina, Hardy J, MD 12/19/17 [phone]

## 2017-12-19 NOTE — ED Notes (Addendum)
Sitter advised pt breakfast tray has arrived - pt wiggled feet and turned over - kept eyes closed.

## 2017-12-19 NOTE — ED Notes (Addendum)
Pt was brought over to pod F not medically cleared yet.

## 2017-12-20 NOTE — Progress Notes (Signed)
Per Hillery Jacksanika Lewis NP, pt is mentally stable for discharge. Pt requesting bus pass. Becky RN notified of disposition.  Elajah Kunsman S. Alan RipperHolloway, MSW, LCSW Clinical Social Worker 12/20/2017 12:11 PM

## 2017-12-20 NOTE — Discharge Instructions (Signed)
You were evaluated in the Emergency Department and after careful evaluation, we did not find any emergent condition requiring admission or further testing in the hospital. ° °Please return to the Emergency Department if you experience any worsening of your condition.  We encourage you to follow up with a primary care provider.  Thank you for allowing us to be a part of your care. °

## 2017-12-20 NOTE — ED Notes (Signed)
Pt called and [facility]. States he was advised this by NP, [facility], via [employer]. Advised pt this was not communicated to the EDP nor this RN and questioned if he may have been referring to prior paperwork as he stated that it was written on his d/c papers. Advised pt RN unable to locate this information on d/c paperwork from today. Pt advised [facility]. RN encouraged pt to follow up w/[facility] in am to inquire as he had his daily dose of med this am. Voiced understanding.

## 2017-12-20 NOTE — ED Notes (Signed)
Pt now awake - ambulated to bathroom and back to room. Eating breakfast.

## 2017-12-20 NOTE — ED Notes (Signed)
Denies SI/HI. Shower supplies given as requested.

## 2017-12-20 NOTE — ED Notes (Signed)
Telepsych completed.  

## 2017-12-20 NOTE — Consult Note (Signed)
Telepsych Consultation   Reason for Consult: Substance induced mood disorder Referring Physician:  EPD Location of Patient: [place] Location of Provider: [facility]  Patient Identification: George Walls MRN:  [id number] Principal Diagnosis: <principal problem not specified> Diagnosis:   Patient Active Problem List   Diagnosis Date Noted  . Polysubstance abuse ([place]) [F19.10]   . AKI (acute kidney injury) ([place]) [N17.9] 12/13/2017  . Opioid withdrawal ([place]) [F11.23] 12/13/2017  . Rhabdomyolysis [M62.82] 12/13/2017  . Cocaine abuse with cocaine-induced mood disorder ([place]) [F14.14] 01/20/2017  . Substance induced mood disorder ([place]) [F19.94] 11/25/2016  . Hepatitis C [B19.20] 10/23/2016  . Bronchitis, acute [J20.9] 10/23/2016  . Dental caries [K02.9] 10/23/2016  . Polysubstance dependence including opioid type drug with complication, episodic abuse ([place]) [F19.20] 10/21/2016  . Aspiration pneumonia ([place]) [J69.0] 10/21/2016  . Sepsis ([place]) [A41.9] 10/21/2016  . Transaminitis [R74.0] 10/18/2016  . Elevated troponin [R74.8] 10/18/2016  . Accidental drug overdose [T50.901A] 10/17/2016  . Pulmonary edema, noncardiac [J81.1] 10/17/2016  . Acute on chronic respiratory failure with hypoxia ([facility]) [J96.21] 10/17/2016    Total Time spent with patient: 15 minutes  Subjective:   George Walls is a 32 y.o. male was evaluated and restarted on Zoloft 150 mg for mood stabilization. Patient reports he was feeling depressed and overwhelmed on admission. Currently patient is denying suicidal  or homicidal ideations. Reports he is follow-up by [facility] for medication management. George Walls reports increased anxiety however hasn't taken gabapention for the past 5 days.  Patient has declined additional resources for substance abuse. Patient is requesting a bus pass.  Patient was evaluated overnight for self injurious   behavioral. During this assessment George Walls continues to  deny plan or intent. Case was staffed with MD Dwyane Dee. Patient is recommend for discharge and he is to follow-up with [facility]. Support,reassurance and encouragement was provided.   HPI: Per Assessment note:  George Walls is an 32 y.o. male presenting voluntarily to [facility] ED complaining of suicidal ideation. Patient stated that he was been feeling suicidal for 5 days since he ran out of his Zoloft. Patient endorsed current suicidal ideation without a plan during assessment. Patient reported to triage nurse he planned to overdose. Patient denied HI and AVH. Patient presented guarded during assessment and was not forthcoming with information regarding his substance use. According to records, patient has had multiple hospitalizations for drug use, including a recent overdose on Fentanyl in July. Patient accessed ED again on 12/09/2017 for a heroin overdose. Patient is currently receiving medication management from [facility] but states he is out of medications. Patient reported being diagnosed with depression for 10 years and has consistently been taking medications until the past 5 days. Patient reported being hospitalized 5 years ago at [facility] for depression. Patient denied history of abuse and criminal charges. Patient denied family mental health history.  Past Psychiatric History:  Risk to Self: Suicidal Ideation: Yes-Currently Present Suicidal Intent: No-Not Currently/Within Last 6 Months Is patient at risk for suicide?: Yes Suicidal Plan?: No-Not Currently/Within Last 6 Months(yesterday stated he wanted to overdose) Access to Means: Yes Specify Access to Suicidal Means: prescriptions/ illicit drugs What has been your use of drugs/alcohol within the last 12 months?: (patient denies- but according to records polysub use) How many times?: 0 Other Self Harm Risks: drug use Triggers for Past Attempts: (n/a) Intentional Self Injurious Behavior: None Risk to Others: Homicidal Ideation: No Thoughts of Harm  to Others: No Current Homicidal Intent: No Current Homicidal Plan: No Access to Homicidal Means: No  Identified Victim: n/a History of harm to others?: No Assessment of Violence: None Noted Violent Behavior Description: n/a Does patient have access to weapons?: No Criminal Charges Pending?: No Does patient have a court date: No Prior Inpatient Therapy: Prior Inpatient Therapy: Yes Reason for Treatment: depression Prior Outpatient Therapy: Prior Outpatient Therapy: Yes Prior Therapy Dates: (patient unsure) Prior Therapy Facilty/Provider(s): [facility] Reason for Treatment: depression Does patient have an ACCT team?: No Does patient have Intensive In-House Services?  : No Does patient have [facility] services? : Yes Does patient have P4CC services?: No  Past Medical History:  Past Medical History:  Diagnosis Date  . Hepatitis C   . IVDU (intravenous drug user)   . Polysubstance dependence including opioid type drug with complication, episodic abuse ([place])   . Suicidal ideation    History reviewed. No pertinent surgical history. Family History:  Family History  Problem Relation Age of Onset  . CAD Neg Hx   . Bleeding Disorder Neg Hx    Family Psychiatric  History:  Social History:  Social History   Substance and Sexual Activity  Alcohol Use No     Social History   Substance and Sexual Activity  Drug Use Yes  . Types: Cocaine, Marijuana, IV   Comment: Fentyanl, heroin    Social History   Socioeconomic History  . Marital status: Divorced    Spouse name: Not on file  . Number of children: Not on file  . Years of education: Not on file  . Highest education level: Not on file  Occupational History  . Not on file  Social Needs  . Financial resource strain: Not on file  . Food insecurity:    Worry: Not on file    Inability: Not on file  . Transportation needs:    Medical: Not on file    Non-medical: Not on file  Tobacco Use  . Smoking status: Current Every Day  Smoker    Packs/day: 2.00    Types: Cigarettes  . Smokeless tobacco: Never Used  Substance and Sexual Activity  . Alcohol use: No  . Drug use: Yes    Types: Cocaine, Marijuana, IV    Comment: Fentyanl, heroin  . Sexual activity: Not on file  Lifestyle  . Physical activity:    Days per week: Not on file    Minutes per session: Not on file  . Stress: Not on file  Relationships  . Social connections:    Talks on phone: Not on file    Gets together: Not on file    Attends religious service: Not on file    Active member of club or organization: Not on file    Attends meetings of clubs or organizations: Not on file    Relationship status: Not on file  Other Topics Concern  . Not on file  Social History Narrative  . Not on file   Additional Social History:    Allergies:   Allergies  Allergen Reactions  . Sulfa Antibiotics Other (See Comments)    Reaction:  Unknown     Labs:  Results for orders placed or performed during the hospital encounter of 12/19/17 (from the past 48 hour(s))  Rapid urine drug screen (hospital performed)     Status: Abnormal   Collection Time: 12/19/17  2:23 AM  Result Value Ref Range   Opiates NONE DETECTED NONE DETECTED   Cocaine NONE DETECTED NONE DETECTED   Benzodiazepines POSITIVE (A) NONE DETECTED   Amphetamines NONE DETECTED NONE DETECTED  Tetrahydrocannabinol NONE DETECTED NONE DETECTED   Barbiturates NONE DETECTED NONE DETECTED    Comment: (NOTE) DRUG SCREEN FOR MEDICAL PURPOSES ONLY.  IF CONFIRMATION IS NEEDED FOR ANY PURPOSE, NOTIFY LAB WITHIN 5 DAYS. LOWEST DETECTABLE LIMITS FOR URINE DRUG SCREEN Drug Class                     Cutoff (ng/mL) Amphetamine and metabolites    1000 Barbiturate and metabolites    200 Benzodiazepine                 503 Tricyclics and metabolites     300 Opiates and metabolites        300 Cocaine and metabolites        300 THC                            50 Performed at [facility], [place] [address] [place], [place] [address]   Comprehensive metabolic panel     Status: Abnormal   Collection Time: 12/19/17  3:00 AM  Result Value Ref Range   Sodium 141 135 - 145 mmol/L   Potassium 3.3 (L) 3.5 - 5.1 mmol/L   Chloride 107 98 - 111 mmol/L   CO2 18 (L) 22 - 32 mmol/L   Glucose, Bld 98 70 - 99 mg/dL   BUN 9 6 - 20 mg/dL   Creatinine, Ser 0.95 0.61 - 1.24 mg/dL   Calcium 8.7 (L) 8.9 - 10.3 mg/dL   Total Protein 6.8 6.5 - 8.1 g/dL   Albumin 3.7 3.5 - 5.0 g/dL   AST 30 15 - 41 U/L   ALT 45 (H) 0 - 44 U/L   Alkaline Phosphatase 50 38 - 126 U/L   Total Bilirubin 1.1 0.3 - 1.2 mg/dL   GFR calc non Af Amer >60 >60 mL/min   GFR calc Af Amer >60 >60 mL/min    Comment: (NOTE) The eGFR has been calculated using the CKD EPI equation. This calculation has not been validated in all clinical situations. eGFR's persistently <60 mL/min signify possible Chronic Kidney Disease.    Anion gap 16 (H) 5 - 15    Comment: Performed at [facility], [place] [address], [place], [place] [address]  Ethanol     Status: None   Collection Time: 12/19/17  3:00 AM  Result Value Ref Range   Alcohol, Ethyl (B) <10 <10 mg/dL    Comment: (NOTE) Lowest detectable limit for serum alcohol is 10 mg/dL. For medical purposes only. Performed at [facility], [place] [address] [place], [place] [address]   Salicylate level     Status: None   Collection Time: 12/19/17  3:00 AM  Result Value Ref Range   Salicylate Lvl <7.4 2.8 - 30.0 mg/dL    Comment: Performed at [facility] [address], [place], [place] [address]  Acetaminophen level     Status: Abnormal   Collection Time: 12/19/17  3:00 AM  Result Value Ref Range   Acetaminophen (Tylenol), Serum <10 (L) 10 - 30 ug/mL    Comment: (NOTE) Therapeutic concentrations vary significantly. A range of 10-30 ug/mL  may be an effective concentration for many patients. However, some  are best treated at concentrations outside of this  range. Acetaminophen concentrations >150 ug/mL at 4 hours after ingestion  and >50 ug/mL at 12 hours after ingestion are often associated with  toxic reactions.  Performed at [facility], [place] [address], [place], [place] [address]   cbc     Status: None   Collection Time: 12/19/17  3:00 AM  Result Value Ref Range   WBC 9.7 4.0 - 10.5 K/uL   RBC 4.65 4.22 - 5.81 MIL/uL   Hemoglobin 15.2 13.0 - 17.0 g/dL   HCT 44.0 39.0 - 52.0 %   MCV 94.6 78.0 - 100.0 fL   MCH 32.7 26.0 - 34.0 pg   MCHC 34.5 30.0 - 36.0 g/dL   RDW 11.8 11.5 - 15.5 %   Platelets 302 150 - 400 K/uL    Comment: Performed at [facility] [address] [place], [place] [address]    Medications:  Current Facility-Administered Medications  Medication Dose Route Frequency Provider Last Rate Last Dose  . hydrOXYzine (ATARAX/VISTARIL) tablet 25 mg  25 mg Oral TID PRN Hayden Rasmussen, MD   25 mg at 12/20/17 1021  . nicotine (NICODERM CQ - dosed in mg/24 hours) patch 21 mg  21 mg Transdermal Daily Upstill, Shari, PA-C   21 mg at 12/20/17 1056  . sertraline (ZOLOFT) tablet 150 mg  150 mg Oral Daily Hayden Rasmussen, MD   150 mg at 12/20/17 1021   Current Outpatient Medications  Medication Sig Dispense Refill  . cloNIDine (CATAPRES) 0.1 MG tablet Take 1 tablet (0.1 mg total) by mouth daily. For withdrawal symptoms 5 tablet 0  . gabapentin (NEURONTIN) 300 MG capsule 673m by mouth q AM and 9035mby mouth q PM (Patient taking differently: Take 600-900 mg by mouth See admin instructions. Take 2 capsules by mouth every morning and take 3 capsules every evening) 150 capsule 1  . sertraline (ZOLOFT) 100 MG tablet Take 1.5 tablets (150 mg total) by mouth daily. 45 tablet 1    Musculoskeletal: Strength & Muscle Tone:  Gait & Station:  Patient leans:   Psychiatric Specialty Exam: Physical Exam  Vitals reviewed. Constitutional: He appears well-developed.  Cardiovascular: Normal rate.  Neurological: He is alert.   Psychiatric: He has a normal mood and affect. His behavior is normal.    Review of Systems  Psychiatric/Behavioral: Positive for depression (patient was restarted on zolft ) and substance abuse. Negative for hallucinations and suicidal ideas. The patient is nervous/anxious.   All other systems reviewed and are negative.   Blood pressure (!) 140/95, pulse 73, temperature 97.9 F (36.6 C), temperature source Oral, resp. rate 16, SpO2 97 %.There is no height or weight on file to calculate BMI.  General Appearance: Casual paper scrubs   Eye Contact:  Fair  Speech:  Clear and Coherent  Volume:  Normal  Mood:  Anxious and Depressed  Affect:  Congruent  Thought Process:  Coherent  Orientation:  Full (Time, Place, and Person)  Thought Content:  Logical  Suicidal Thoughts:  No  Homicidal Thoughts:  No  Memory:  Immediate;   Fair Recent;   Fair Remote;   Fair  Judgement:  Fair  Insight:  Lacking  Psychomotor Activity:  Normal  Concentration:  Concentration: Fair  Recall:  [employer] Knowledge:  Fair  Language:  Fair  Akathisia:  No  Handed:  Right  AIMS (if indicated):     Assets:  Communication Skills Desire for Improvement Resilience Social Support  ADL's:  Intact  Cognition:  WNL  Sleep:        Treatment Plan Summary: Daily contact with patient to assess and evaluate symptoms and progress  in treatment. Attempted to contact EDP [place] to provided discharge disposition recommendation.  RN Jacqlyn Larsen made aware.- f/u with MD    Disposition: No evidence of imminent risk to self or others at present.   Patient does not meet criteria for psychiatric inpatient admission. Supportive therapy provided about ongoing stressors. Discussed crisis plan, support from social network, calling 911, coming to the Emergency Department, and calling Suicide Hotline.   CSW to fax additional resources for housing and substance abuse programs  This service was provided via telemedicine using a  2-way, interactive audio and Radiographer, therapeutic.  Names of all persons participating in this telemedicine service and their role in this encounter. Name: George Walls  Role: patient   Name: T. Rushil Kimbrell Role: NP   [place]ill [place], NP 12/20/2017 11:59 AM

## 2017-12-20 NOTE — ED Notes (Addendum)
Pt voiced understanding and agreement w/tx plan - d/c. Pt called someone for ride from ED. Pt aware Dr Pilar PlateBero is aware and RN will d/c pt once has received paperwork.

## 2017-12-20 NOTE — ED Notes (Signed)
Pt on phone at nurses' desk. 

## 2018-10-28 IMAGING — CT CT MAXILLOFACIAL W/O CM
3 series · 15 of 47 positions shown, 18 images · non-contrast
Comparison: None.

CLINICAL DATA: 31-year-old male with suspected left side dental
infection. Left face easier and neck pain for 2 weeks.

EXAM:
CT MAXILLOFACIAL WITHOUT CONTRAST
TECHNIQUE: Multidetector CT imaging of the maxillofacial structures was
performed. Multiplanar CT image reconstructions were also generated.

[Series 3: facial st · axial · 0.29mm/px · z∈[-258,-120]mm · 9 of 81 slices shown, 12 images]
[im 6/81  brain]
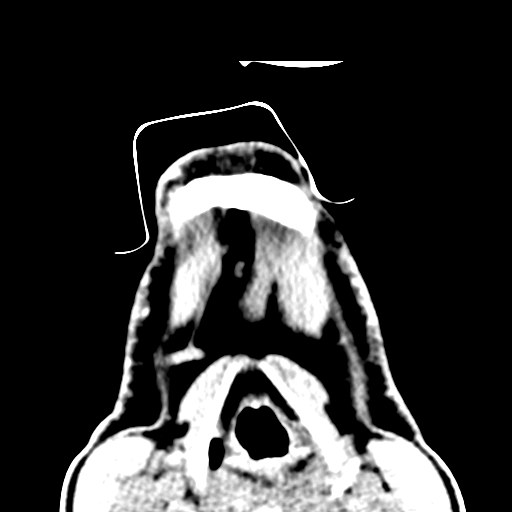
[im 6/81  bone]
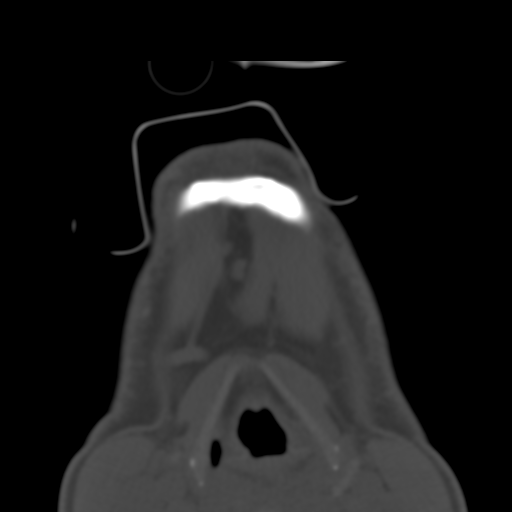
[im 14/81  bone]
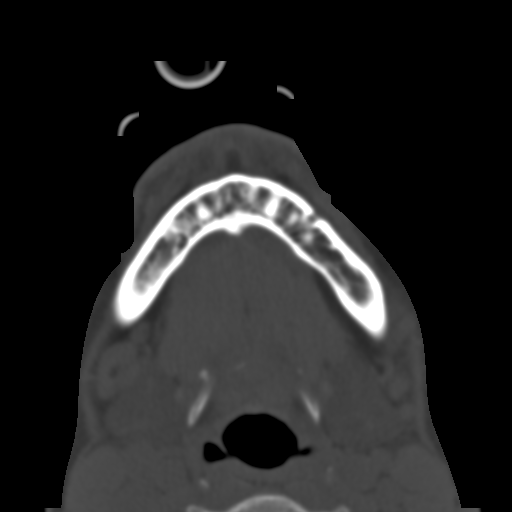
[im 23/81  bone]
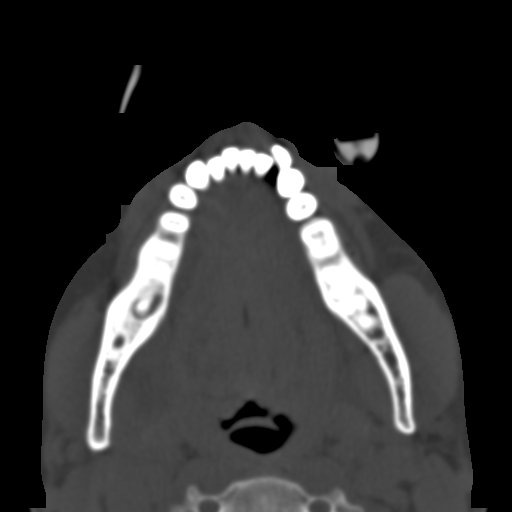
[im 31/81  bone]
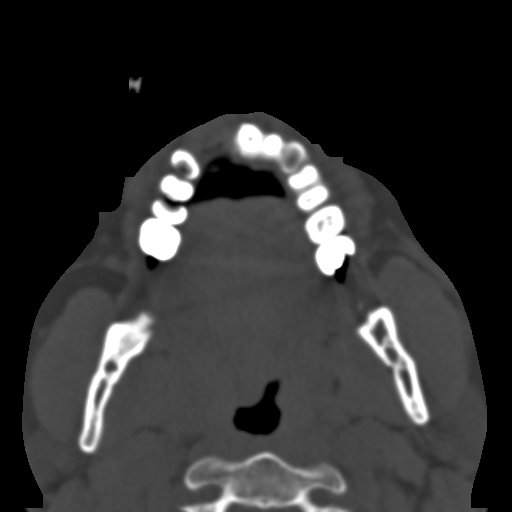
[im 42/81  brain]
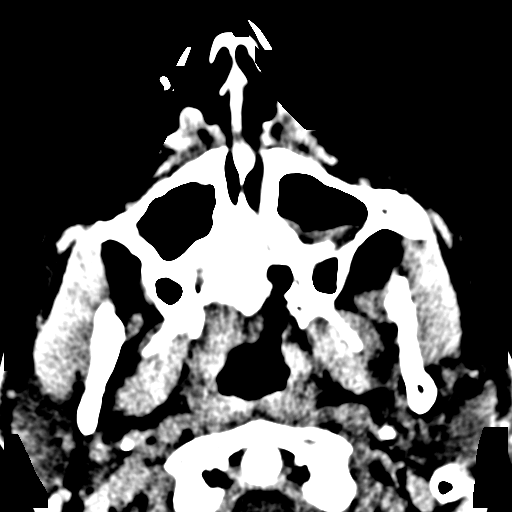
[im 42/81  bone]
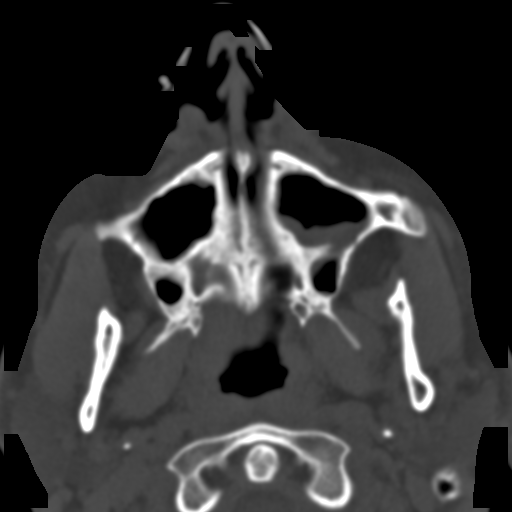
[im 50/81  bone]
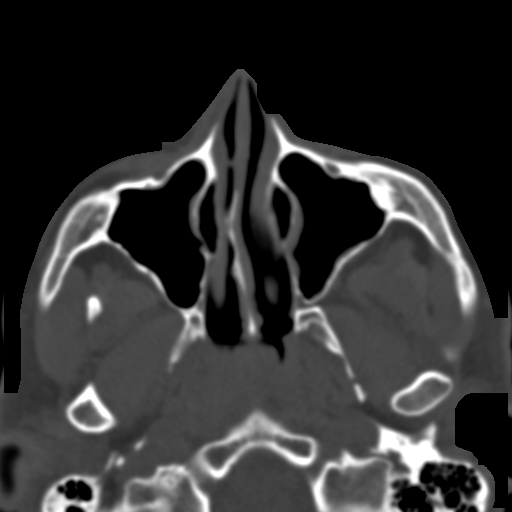
[im 58/81  bone]
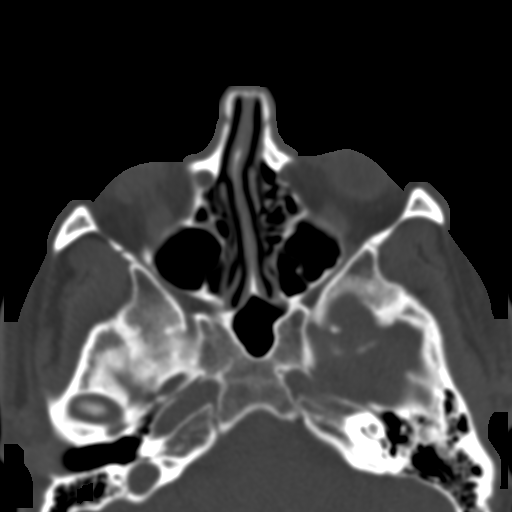
[im 67/81  bone]
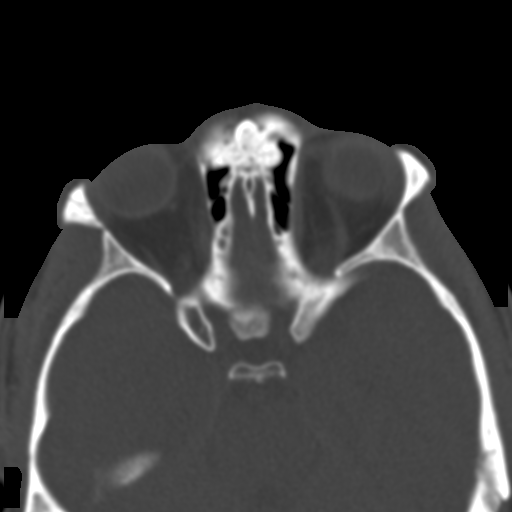
[im 75/81  brain]
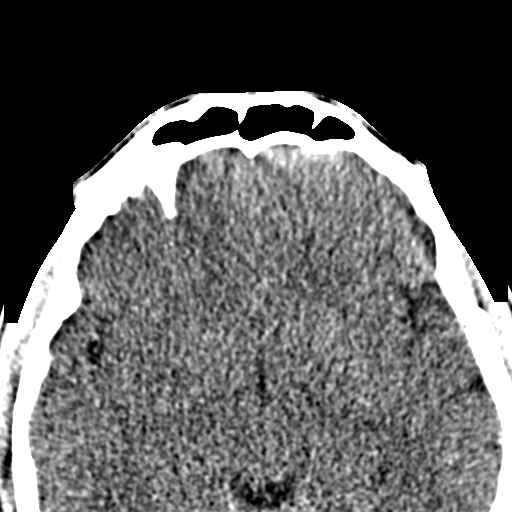
[im 75/81  bone]
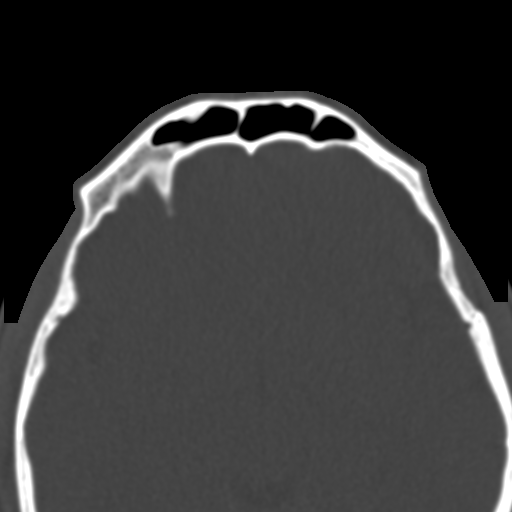

[Series 7: coronal st · coronal · 0.30mm/px · 3 of 83 slices shown]
[im 28/83  bone]
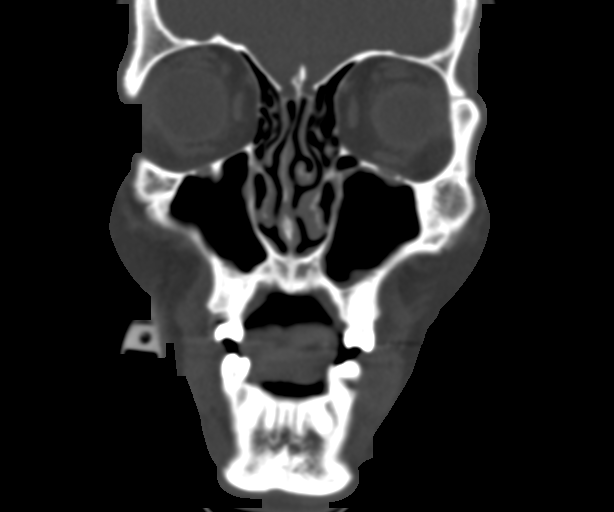
[im 37/83  bone]
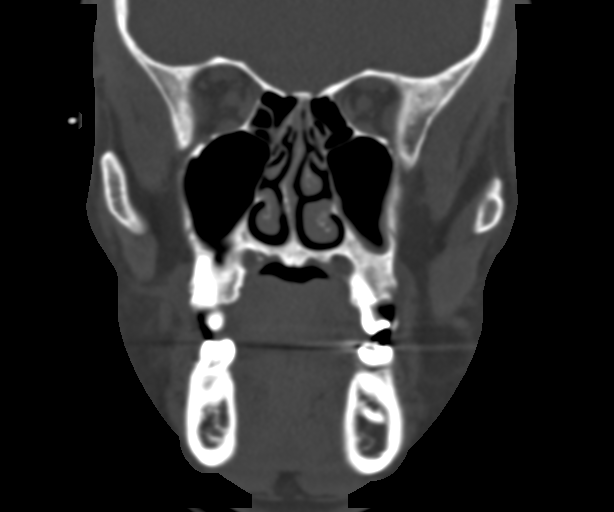
[im 46/83  bone]
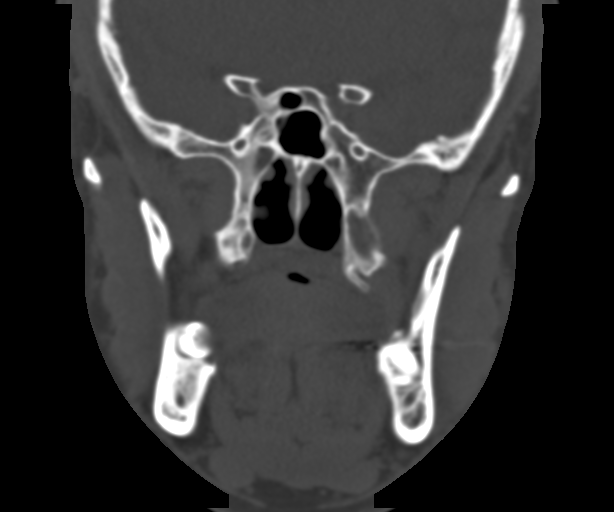

[Series 8: sagittal st · sagittal · 0.31mm/px · 3 of 93 slices shown]
[im 31/93  bone]
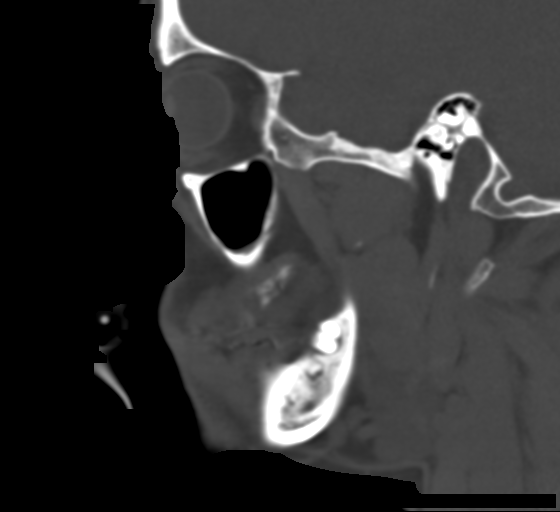
[im 47/93  bone]
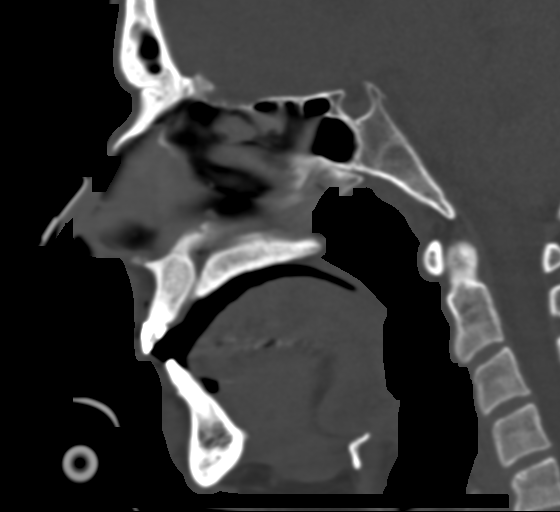
[im 62/93  bone]
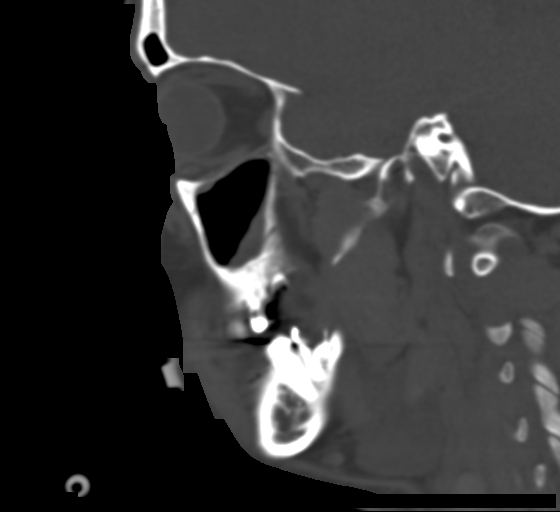

[15 of 47 positions shown; findings below may reference images not displayed]

FINDINGS: Osseous: Widespread carious and poor dentition. Right maxillary and
mandible posterior molars an wisdom teeth are among the worst
(series 10, image 34). Prominent dental caries of the left mandible
wisdom tooth, and left maxillary molar an wisdom tooth. Periapical
lucency at the left maxillary wisdom tooth, and also the left
maxillary canine, the root of which is in close proximity to
alveolar recess mucosal thickening of the left maxillary sinus. See
series 4, image 39 and series 10, image 55.

No mandible osteomyelitis. No acute facial bone fracture. Visible
central skullbase and calvarium appear intact. Negative visible
cervical spine.

Orbits: Bilateral orbital walls are intact. Bilateral orbits soft
tissues are normal.

Sinuses: Bilateral tympanic cavities and mastoids are clear. The
right paranasal sinuses are clear. The left paranasal sinuses are
clear aside from mild left maxillary sinus mucosal thickening, most
pronounced at the alveolar recess.

Rightward nasal septal deviation and spurring. Symmetric but mild
bilateral nasal cavity mucosal thickening.

Soft tissues: Negative noncontrast appearance of the visible larynx,
pharynx, parapharyngeal spaces, retropharyngeal space, sublingual
space, submandibular glands, and parotid glands. No cervical
lymphadenopathy.

Limited intracranial: Negative visualized noncontrast brain
parenchyma. Visualized scalp soft tissues are within normal limits.
IMPRESSION: 1. Poor dentition. On the left side the left maxillary canine and
left maxillary posterior molar and maxillary/mandible wisdom teeth
are worst affected. No periapical lucency at the canine which is in
proximity to the mildly inflamed left maxillary sinus.
2. No associated soft tissue cellulitis or soft tissue abscess is
evident in the absence of IV contrast.
